# Patient Record
Sex: Female | Born: 1940 | Race: White | Hispanic: No | Smoking: Never smoker
Health system: Southern US, Community
[De-identification: ages and names within clinical notes are randomized; demographics above are authoritative.]

## PROBLEM LIST (undated history)

## (undated) DIAGNOSIS — I10 Essential (primary) hypertension: Secondary | ICD-10-CM

## (undated) DIAGNOSIS — E785 Hyperlipidemia, unspecified: Secondary | ICD-10-CM

## (undated) DIAGNOSIS — R011 Cardiac murmur, unspecified: Secondary | ICD-10-CM

## (undated) DIAGNOSIS — F32A Depression, unspecified: Secondary | ICD-10-CM

## (undated) DIAGNOSIS — I340 Nonrheumatic mitral (valve) insufficiency: Secondary | ICD-10-CM

## (undated) DIAGNOSIS — Z8719 Personal history of other diseases of the digestive system: Secondary | ICD-10-CM

## (undated) DIAGNOSIS — I509 Heart failure, unspecified: Secondary | ICD-10-CM

## (undated) DIAGNOSIS — E039 Hypothyroidism, unspecified: Secondary | ICD-10-CM

## (undated) DIAGNOSIS — H9319 Tinnitus, unspecified ear: Secondary | ICD-10-CM

## (undated) DIAGNOSIS — R0602 Shortness of breath: Secondary | ICD-10-CM

## (undated) DIAGNOSIS — F329 Major depressive disorder, single episode, unspecified: Secondary | ICD-10-CM

## (undated) HISTORY — DX: Personal history of other diseases of the digestive system: Z87.19

## (undated) HISTORY — PX: BREAST SURGERY: SHX581

## (undated) HISTORY — PX: COLON SURGERY: SHX602

## (undated) HISTORY — DX: Nonrheumatic mitral (valve) insufficiency: I34.0

## (undated) HISTORY — DX: Tinnitus, unspecified ear: H93.19

## (undated) HISTORY — DX: Hyperlipidemia, unspecified: E78.5

---

## 1996-04-06 HISTORY — PX: CHOLECYSTECTOMY: SHX55

## 1998-01-15 ENCOUNTER — Ambulatory Visit (HOSPITAL_COMMUNITY): Admission: RE | Admit: 1998-01-15 | Discharge: 1998-01-15 | Payer: Self-pay

## 1999-02-03 ENCOUNTER — Ambulatory Visit (HOSPITAL_COMMUNITY): Admission: RE | Admit: 1999-02-03 | Discharge: 1999-02-03 | Payer: Self-pay | Admitting: Family Medicine

## 1999-02-03 ENCOUNTER — Encounter: Payer: Self-pay | Admitting: Family Medicine

## 1999-12-01 ENCOUNTER — Other Ambulatory Visit: Admission: RE | Admit: 1999-12-01 | Discharge: 1999-12-01 | Payer: Self-pay | Admitting: Family Medicine

## 2001-02-14 ENCOUNTER — Encounter: Payer: Self-pay | Admitting: Family Medicine

## 2001-02-14 ENCOUNTER — Ambulatory Visit (HOSPITAL_COMMUNITY): Admission: RE | Admit: 2001-02-14 | Discharge: 2001-02-14 | Payer: Self-pay | Admitting: Family Medicine

## 2001-09-28 ENCOUNTER — Encounter: Admission: RE | Admit: 2001-09-28 | Discharge: 2001-09-28 | Payer: Self-pay | Admitting: Family Medicine

## 2001-09-28 ENCOUNTER — Encounter: Payer: Self-pay | Admitting: Family Medicine

## 2003-09-30 ENCOUNTER — Emergency Department (HOSPITAL_COMMUNITY): Admission: EM | Admit: 2003-09-30 | Discharge: 2003-09-30 | Payer: Self-pay | Admitting: *Deleted

## 2003-10-16 ENCOUNTER — Ambulatory Visit (HOSPITAL_COMMUNITY): Admission: RE | Admit: 2003-10-16 | Discharge: 2003-10-16 | Payer: Self-pay | Admitting: Family Medicine

## 2003-12-25 ENCOUNTER — Inpatient Hospital Stay (HOSPITAL_COMMUNITY): Admission: RE | Admit: 2003-12-25 | Discharge: 2003-12-27 | Payer: Self-pay | Admitting: Obstetrics and Gynecology

## 2003-12-25 ENCOUNTER — Encounter (INDEPENDENT_AMBULATORY_CARE_PROVIDER_SITE_OTHER): Payer: Self-pay | Admitting: Specialist

## 2003-12-25 HISTORY — PX: ABDOMINAL HYSTERECTOMY: SHX81

## 2004-05-26 ENCOUNTER — Encounter (INDEPENDENT_AMBULATORY_CARE_PROVIDER_SITE_OTHER): Payer: Self-pay | Admitting: *Deleted

## 2004-05-26 ENCOUNTER — Ambulatory Visit (HOSPITAL_COMMUNITY): Admission: RE | Admit: 2004-05-26 | Discharge: 2004-05-26 | Payer: Self-pay | Admitting: Gastroenterology

## 2006-11-30 ENCOUNTER — Ambulatory Visit (HOSPITAL_COMMUNITY): Admission: RE | Admit: 2006-11-30 | Discharge: 2006-11-30 | Payer: Self-pay | Admitting: Family Medicine

## 2010-06-26 ENCOUNTER — Other Ambulatory Visit: Payer: Self-pay | Admitting: Gastroenterology

## 2010-08-22 NOTE — H&P (Signed)
NAME:  Elizabeth Nicholson, Elizabeth Nicholson         ACCOUNT NO.:  [id number]   MEDICAL RECORD NO.:  [id number]         PATIENT TYPE:  WINP   LOCATION:                                FACILITY:  [facility]   PHYSICIAN:  Malva Limes, M.D.    DATE OF BIRTH:  April 08, 1940   DATE OF ADMISSION:  12/25/2003  DATE OF DISCHARGE:                                HISTORY & PHYSICAL   HISTORY OF PRESENT ILLNESS:  The patient is a 70 year old white female G2,  P2, who presents to [facility] today for a total vaginal hysterectomy,  bilateral salpingo-oophorectomy, anterior and posterior colporrhaphy  secondary to a one year history of worsening uterine prolapse.  The patient  also has a large cystocele and moderate size rectocele which is causing  significant symptoms.  The patient originally went to see Dr. Ewing Schlein for  difficulty with her bowel function.  She stated that she was having to place  her finger in her vagina to allow her to have a bowel movement.  The patient  has seen no blood in her stool and has tried to use laxatives and stool  softeners without success.  The patient has rare loss of urine. At surgery,  she did have a pessary placed and no significant loss of urine was noted at  this time.  The patient had two traumatic vaginal deliveries and a posterior  colporrhaphy approximately 30 years ago.  The patient has not taken estrogen  replacement therapy.  The patient does have first degree uterine prolapse.  The uterus is normal in size and shape, and she has no abnormal bleeding.  Also, prior to surgery, the patient was noted to have endocervical polyp  which was biopsied and was benign.   ALLERGIES:  The patient has an allergy to SULFA.   SOCIAL HISTORY:  She denies smoking, alcohol or drug use. The patient is  employed as a Engineer, mining for [employer].   PAST MEDICAL HISTORY:  The patient is a diabetic who is well controlled with  oral medication.  She also has hypothyroidism.   PAST SURGICAL  HISTORY:  Her past surgeries include two vaginal deliveries,  cholecystectomy, and breast biopsy x2.   CURRENT MEDICATIONS:  1.  Synthroid 88 mcg per day.  2.  Metformin 500 mg per day.  3.  Gemfibrozil 600 mg two times per day.  4.  Benazepril/hydrochlorothiazide 10/12.5 mg every day.   FAMILY HISTORY:  Significant for cerebrovascular disease, hyperlipidemia,  diabetes, and breast cancer.   PHYSICAL EXAMINATION:  VITAL SIGNS:  The patient's vital signs are stable.  She is afebrile.  HEENT:  Exam is within normal limits.  LUNGS:  Her lungs are clear to auscultation.  CARDIOVASCULAR:  Regular rate and rhythm;  no murmurs heard.  ABDOMEN:  Soft, nontender, nondistended.  There are no masses or  organomegaly.  PELVIC:  The external genitalia are atrophic.  There is a small fissure in  the posterior fourchette.  In the vagina, the patient is noted to have a  large cystocele protruding through the introitus along with a moderate  rectocele.  There is evidence of  a past repair in the proximal one-third of  the posterior aspect of the vagina.  She also is noted to have poor anal  sphincter tone.  Uterus is normal size and shape.  There is a first degree  prolapse.  There are no adnexal masses palpated.  EXTREMITIES:  Within normal limits.   IMPRESSION:  1.  Symptomatic uterine prolapse with rectocele and cystocele.  2.  Diabetes mellitus.  3.  Hypothyroidism.   PLAN:  Proceed with total abdominal hysterectomy, bilateral salpingo-  oophorectomy, and anterior and posterior colporrhaphy.      MA/MEDQ  D:  12/24/2003  T:  12/24/2003  Job:  [id number]   cc:   Petra Kuba, M.D.  [address]  [place]  [place] [address]  Fax: [phone]

## 2010-08-22 NOTE — Op Note (Signed)
NAME:  Elizabeth Nicholson, Elizabeth Nicholson                 ACCOUNT NO.:  [id number]   MEDICAL RECORD NO.:  [id number]          PATIENT TYPE:  AMB   LOCATION:  ENDO                         FACILITY:  [facility]   PHYSICIAN:  Petra Kuba, M.D.    DATE OF BIRTH:  1941/01/22   DATE OF PROCEDURE:  DATE OF DISCHARGE:                                 OPERATIVE REPORT   PROCEDURE:  Colonoscopy with hot biopsy.   ENDOSCOPIST:  Petra Kuba, M.D.   INDICATIONS FOR PROCEDURE:  A patient with alternating diarrhea and  constipation, due for a colonic screening.  A consent was signed after the  risks, benefits, methods and options were thoroughly discussed in the  office.   MEDICINES USED:  Demerol 80 mg, Versed 8 mg.   DESCRIPTION OF PROCEDURE:  A rectal inspection is pertinent for external  hemorrhoids, small.  A digital exam was negative.  The video pediatric  adjustable colonoscope was inserted with some difficulty due to a tortuous  sigmoid.  With rolling her on her back and with abdominal pressure, was able  to be advanced to the cecum.  On insertion, left greater than right,  diverticula were seen, but no other abnormalities.  The cecum was identified  by the appendiceal orifice and the ileocecal valve.  The prep was adequate.  There was some liquid stool that required washing and suctioning.  On slow  withdrawal through the colon, a small mid-ascending polyp was seen and was  hot biopsied x2 and put in the first container.  Another tiny polyp was seen  in the splenic flexure which was hot biopsied and put in a separate  container.  No other polypoid lesions were seen as we slowly withdrew back  to the rectum.  The left, greater than right, diverticula were confirmed.  There was a little bit of scope trauma in the distal sigmoid, but no other  abnormalities or complications or problems.  Once back in the rectum, an  anal rectal pull-through on retroflexion confirmed some small hemorrhoids.  The scope was drained.   Air was suctioned.  The scope was removed.   The patient tolerated the procedure well.  There was no immediate  significant complication.   ENDOSCOPIC ASSESSMENT:  1.  Internal and external hemorrhoids.  2.  Left, greater than right, diverticula.  3.  Two tiny small polyps hot biopsied in the ascending and splenic flexure.  4.  Otherwise within normal limits to the cecum.   PLAN:  Will await pathology.  Probably recheck a colon screening in five  years.  Have her to see back p.r.n.  Otherwise return her care to Dr. Jennette Kettle for the customary health care maintenance, to include yearly rectals  and guaiacs.      MEM/MEDQ  D:  05/26/2004  T:  05/26/2004  Job:  [id number]   cc:   Jennette Kettle, M.D.

## 2010-08-22 NOTE — Discharge Summary (Signed)
NAME:  SKY, PRIMO                 ACCOUNT NO.:  [id number]   MEDICAL RECORD NO.:  [id number]          PATIENT TYPE:  INP   LOCATION:  9309                          FACILITY:  [facility]   PHYSICIAN:  Malva Limes, M.D.    DATE OF BIRTH:  22-Apr-1940   DATE OF ADMISSION:  12/25/2003  DATE OF DISCHARGE:  12/27/2003                                 DISCHARGE SUMMARY   PRINCIPAL DISCHARGE DIAGNOSES:  1.  Symptomatic uterine prolapse.  2.  Cystocele.  3.  Rectocele.   PRINCIPAL PROCEDURES:  1.  Total vaginal hysterectomy and bilateral salpingo-oophorectomy.  2.  Anterior and posterior colporrhaphy.   HISTORY OF PRESENT ILLNESS:  Ms. Kathan is a 70 year old white female G2 P2  who [facility] with a 1-year  history of worsening symptoms of uterine prolapse with a large cystocele and  a moderate-size rectocele.  A complete description of the events which led  up to this admission can be found in the dictated History and Physical.   HOSPITAL COURSE:  The patient underwent the procedure on December 25, 2003  without complications.  Estimated blood loss was 250 mL.  The patient's  pathology is benign.  The patient's preoperative hemoglobin was 13.3,  postoperative 11.7.  The patient did have a bowel movement prior to  discharge.  She was eating a regular diet without difficulty.  The patient  underwent a voiding trial on postoperative day #2.  She did have a postvoid  residual of 300 and therefore the catheter was left.  The patient will be  discharged to home with Percocet to take p.r.n.  She will be instructed to  follow up in the office on Monday for her catheter to be removed and another  postvoid residual checked.  The patient also was sent home with Colace 100  mg p.o. b.i.d. and Cipro 250 mg p.o. b.i.d. x3 days.     Mark   MA/MEDQ  D:  12/27/2003  T:  12/28/2003  Job:  [id number]

## 2010-08-22 NOTE — Op Note (Signed)
NAME:  Elizabeth Nicholson, Elizabeth Nicholson                 ACCOUNT NO.:  [id number]   MEDICAL RECORD NO.:  [id number]          PATIENT TYPE:  INP   LOCATION:  9309                          FACILITY:  [facility]   PHYSICIAN:  Malva Limes, M.D.    DATE OF BIRTH:  November 16, 1940   DATE OF PROCEDURE:  12/25/2003  DATE OF DISCHARGE:                                 OPERATIVE REPORT   PREOPERATIVE DIAGNOSES:  1.  Symptomatic uterine prolapse.  2.  Cystocele.  3.  Rectocele.   POSTOPERATIVE DIAGNOSES:  1.  Symptomatic uterine prolapse.  2.  Cystocele.  3.  Rectocele.   PROCEDURE:  1.  Total vaginal hysterectomy.  2.  Bilateral salpingo-oophorectomy.  3.  Anterior colporrhaphy.  4.  Posterior colporrhaphy.   SURGEON:  Malva Limes, M.D.   ASSISTANT:  Luvenia Redden, M.D.   ANESTHESIA:  General endotracheal.   ANTIBIOTICS:  Ancef 1 g.   ESTIMATED BLOOD LOSS:  250 mL.   COMPLICATIONS:  None.   SPECIMENS:  Cervix, uterus, fallopian tubes and ovaries sent to pathology.   DRAINS:  Foley bedside drainage.   DESCRIPTION OF PROCEDURE:  The patient was taken to the operating room where  she was placed in the dorsal supine position. A general anesthetic was  administered without complications. She was then placed in dorsal lithotomy  position. She was prepped with Hibiclens and her bladder was drained with a  red rubber catheter.  She was draped in the usual fashion for this  procedure.  A weighted speculum was placed in the vagina, the cervix was  then injected with lidocaine with 1% epinephrine.  The posterior cul-de-sac  was entered sharply. The uterosacral ligaments were then bilaterally  clamped, cut, and ligated with #0 Monocryl suture. The remaining cervix was  then circumscribed, the anterior cul-de-sac was entered sharply. The bladder  __________ were then bilaterally clamped, cut, and ligated with #0 Monocryl  suture. The cardinal ligaments were serially clamped, cut, and ligated with  #0 Monocryl  suture. The uterine vessels were bilaterally clamped, cut, and  ligated with #0 Monocryl suture. Once the level of the fallopian tube was  reached, the uterus was inverted and the triple pedicle which included the  ovarian ligament, the fallopian tube and the round ligament were bilaterally  clamped, cut and the specimen removed. At this point, the right adnexa was  isolated, clamped, cut, and ligated x2 with #0 Monocryl suture. A similar  procedure was performed on the opposite side. At this point, pedicles are  checked and found to be hemostatic.  There was a small area of vaginal  bleeding on the left vaginal cuff which was made hemostatic with interrupted  #0 Vicryl suture in a figure-of-eight fashion.  At this point, the bowel was  packed away with a 4 x 18 and the pedicles all checked.  Hemostasis appeared  to be adequate.  The Foley catheter was then placed and clear yellow urine  noted.  The sponge was then removed and the parietoperitoneum closed with 2-  0 Vicryl in a pursestring fashion.  At this point,  the anterior vaginal cuff  was grasped with two Allis's and injected with 1% lidocaine with  epinephrine. The anterior vaginal wall was then opened in a vertical fashion  and the large cystocele dissected away from the vaginal mucosa.  Once this  was accomplished, Kelly plication was performed using 2-0 Vicryl suture. The  large cystocele was then reduced using 2-0 Vicryl in pursestring and  interrupted fashion.  Following this, the excess vaginal mucosa was excised  and the anterior vaginal wall closed using 2-0 Vicryl in a running locking  fashion.  Also at this point, the posterior vaginal cuff was closed using 2-  0 Vicryl in a running fashion in a vertical manner.  This concluded the  anterior colporrhaphy and the vaginal hysterectomy.  At this point, the  perineum was grasped with two Allis clamps and the posterior vagina injected  with 1% lidocaine with epinephrine.  This  area was then incised with the  blade and opened using the Metzenbaum scissors in a vertical fashion. The  rectocele was then dissected from the fascia and reduced using 2-0 Vicryl in  a running fashion.  The rectal myofascia was then reapproximated in the  midline using 2-0 Vicryl suture. The posterior vaginal wall was then closed  using 2-0 Vicryl in a running locking fashion and the perineum repaired in a  manner similar to an episiotomy. This concluded the procedure. The vagina  was then packed using 2 inch iodoform gauze. The patient was extubated and  taken to the recovery room in stable condition.  Instrument and lab counts  were correct x2.      MA/MEDQ  D:  12/25/2003  T:  12/25/2003  Job:  [id number]

## 2010-09-25 ENCOUNTER — Inpatient Hospital Stay (INDEPENDENT_AMBULATORY_CARE_PROVIDER_SITE_OTHER)
Admission: RE | Admit: 2010-09-25 | Discharge: 2010-09-25 | Disposition: A | Discharge status: Home / Self Care | Payer: Medicare Other | Source: Ambulatory Visit | Attending: Family Medicine | Admitting: Family Medicine

## 2010-09-25 DIAGNOSIS — IMO0002 Reserved for concepts with insufficient information to code with codable children: Secondary | ICD-10-CM

## 2012-01-05 HISTORY — PX: CARDIAC CATHETERIZATION: SHX172

## 2012-01-07 ENCOUNTER — Inpatient Hospital Stay (HOSPITAL_COMMUNITY)
Admission: AD | Admit: 2012-01-07 | Discharge: 2012-01-10 | DRG: 287 | Disposition: A | Discharge status: Home / Self Care | Payer: Medicare Other | Source: Ambulatory Visit | Attending: Interventional Cardiology | Admitting: Interventional Cardiology

## 2012-01-07 ENCOUNTER — Inpatient Hospital Stay (HOSPITAL_COMMUNITY): Payer: Medicare Other

## 2012-01-07 ENCOUNTER — Encounter (HOSPITAL_COMMUNITY): Payer: Self-pay | Admitting: Interventional Cardiology

## 2012-01-07 DIAGNOSIS — E785 Hyperlipidemia, unspecified: Secondary | ICD-10-CM | POA: Diagnosis present

## 2012-01-07 DIAGNOSIS — Z8249 Family history of ischemic heart disease and other diseases of the circulatory system: Secondary | ICD-10-CM

## 2012-01-07 DIAGNOSIS — I050 Rheumatic mitral stenosis: Secondary | ICD-10-CM | POA: Diagnosis present

## 2012-01-07 DIAGNOSIS — I5032 Chronic diastolic (congestive) heart failure: Secondary | ICD-10-CM | POA: Diagnosis present

## 2012-01-07 DIAGNOSIS — I5031 Acute diastolic (congestive) heart failure: Principal | ICD-10-CM | POA: Diagnosis present

## 2012-01-07 DIAGNOSIS — N179 Acute kidney failure, unspecified: Secondary | ICD-10-CM | POA: Diagnosis present

## 2012-01-07 DIAGNOSIS — I10 Essential (primary) hypertension: Secondary | ICD-10-CM | POA: Diagnosis present

## 2012-01-07 DIAGNOSIS — Z794 Long term (current) use of insulin: Secondary | ICD-10-CM

## 2012-01-07 DIAGNOSIS — Z8719 Personal history of other diseases of the digestive system: Secondary | ICD-10-CM | POA: Insufficient documentation

## 2012-01-07 DIAGNOSIS — I2510 Atherosclerotic heart disease of native coronary artery without angina pectoris: Secondary | ICD-10-CM | POA: Diagnosis present

## 2012-01-07 DIAGNOSIS — E119 Type 2 diabetes mellitus without complications: Secondary | ICD-10-CM | POA: Diagnosis present

## 2012-01-07 DIAGNOSIS — I509 Heart failure, unspecified: Secondary | ICD-10-CM | POA: Diagnosis present

## 2012-01-07 DIAGNOSIS — E669 Obesity, unspecified: Secondary | ICD-10-CM | POA: Diagnosis present

## 2012-01-07 DIAGNOSIS — E039 Hypothyroidism, unspecified: Secondary | ICD-10-CM | POA: Diagnosis present

## 2012-01-07 HISTORY — DX: Shortness of breath: R06.02

## 2012-01-07 HISTORY — DX: Major depressive disorder, single episode, unspecified: F32.9

## 2012-01-07 HISTORY — DX: Cardiac murmur, unspecified: R01.1

## 2012-01-07 HISTORY — DX: Depression, unspecified: F32.A

## 2012-01-07 HISTORY — DX: Heart failure, unspecified: I50.9

## 2012-01-07 HISTORY — DX: Essential (primary) hypertension: I10

## 2012-01-07 HISTORY — DX: Hypothyroidism, unspecified: E03.9

## 2012-01-07 LAB — COMPREHENSIVE METABOLIC PANEL
AST: 35 U/L (ref 0–37)
Albumin: 4.2 g/dL (ref 3.5–5.2)
Alkaline Phosphatase: 84 U/L (ref 39–117)
BUN: 21 mg/dL (ref 6–23)
CO2: 25 mEq/L (ref 19–32)
Chloride: 104 mEq/L (ref 96–112)
GFR calc non Af Amer: 50 mL/min — ABNORMAL LOW (ref >90)
Potassium: 4.3 mEq/L (ref 3.5–5.1)
Total Bilirubin: 0.6 mg/dL (ref 0.3–1.2)

## 2012-01-07 LAB — CBC WITH DIFFERENTIAL/PLATELET
Basophils Absolute: 0.1 10*3/uL (ref 0.0–0.1)
Basophils Relative: 1 % (ref 0–1)
Eosinophils Relative: 1 % (ref 0–5)
HCT: 40.5 % (ref 36.0–46.0)
Hemoglobin: 14.1 g/dL (ref 12.0–15.0)
MCH: 31.2 pg (ref 26.0–34.0)
MCHC: 34.8 g/dL (ref 30.0–36.0)
MCV: 89.6 fL (ref 78.0–100.0)
Monocytes Absolute: 0.6 10*3/uL (ref 0.1–1.0)
Monocytes Relative: 5 % (ref 3–12)
Neutro Abs: 9.9 10*3/uL — ABNORMAL HIGH (ref 1.7–7.7)
RDW: 13.2 % (ref 11.5–15.5)

## 2012-01-07 LAB — PROTIME-INR: Prothrombin Time: 13.6 seconds (ref 11.6–15.2)

## 2012-01-07 LAB — PRO B NATRIURETIC PEPTIDE: Pro B Natriuretic peptide (BNP): 506.6 pg/mL — ABNORMAL HIGH (ref 0–125)

## 2012-01-07 MED ORDER — ENOXAPARIN SODIUM 40 MG/0.4ML ~~LOC~~ SOLN
40.0000 mg | SUBCUTANEOUS | Status: DC
  Administered 2012-01-07: 40 mg via SUBCUTANEOUS
  Filled 2012-01-07 (×2): qty 0.4

## 2012-01-07 MED ORDER — INSULIN ASPART 100 UNIT/ML ~~LOC~~ SOLN
0.0000 [IU] | Freq: Three times a day (TID) | SUBCUTANEOUS | Status: DC
  Administered 2012-01-07 – 2012-01-09 (×3): 1 [IU] via SUBCUTANEOUS
  Administered 2012-01-09: 17:00:00 via SUBCUTANEOUS
  Administered 2012-01-10: 1 [IU] via SUBCUTANEOUS

## 2012-01-07 MED ORDER — ASPIRIN EC 81 MG PO TBEC
81.0000 mg | DELAYED_RELEASE_TABLET | Freq: Every day | ORAL | Status: DC
  Administered 2012-01-07 – 2012-01-10 (×3): 81 mg via ORAL
  Filled 2012-01-07 (×4): qty 1

## 2012-01-07 MED ORDER — SODIUM CHLORIDE 0.9 % IJ SOLN
3.0000 mL | Freq: Two times a day (BID) | INTRAMUSCULAR | Status: DC
  Administered 2012-01-07 – 2012-01-08 (×2): 3 mL via INTRAVENOUS

## 2012-01-07 MED ORDER — ACETAMINOPHEN 325 MG PO TABS: 650.0000 mg | ORAL_TABLET | ORAL | Status: DC | PRN

## 2012-01-07 MED ORDER — INSULIN LISPRO 100 UNIT/ML ~~LOC~~ SOLN: 12.0000 [IU] | Freq: Three times a day (TID) | SUBCUTANEOUS | Status: DC

## 2012-01-07 MED ORDER — IBUPROFEN 400 MG PO TABS
400.0000 mg | ORAL_TABLET | Freq: Four times a day (QID) | ORAL | Status: DC | PRN
  Filled 2012-01-07: qty 1

## 2012-01-07 MED ORDER — SODIUM CHLORIDE 0.9 % IJ SOLN: 3.0000 mL | Freq: Two times a day (BID) | INTRAMUSCULAR | Status: DC

## 2012-01-07 MED ORDER — SODIUM CHLORIDE 0.9 % IV SOLN: INTRAVENOUS | Status: DC

## 2012-01-07 MED ORDER — FUROSEMIDE 10 MG/ML IJ SOLN
40.0000 mg | Freq: Two times a day (BID) | INTRAMUSCULAR | Status: DC
  Administered 2012-01-07 (×2): 40 mg via INTRAVENOUS
  Filled 2012-01-07 (×4): qty 4

## 2012-01-07 MED ORDER — ATORVASTATIN CALCIUM 10 MG PO TABS
10.0000 mg | ORAL_TABLET | Freq: Every day | ORAL | Status: DC
  Administered 2012-01-08 – 2012-01-09 (×2): 10 mg via ORAL
  Filled 2012-01-07 (×4): qty 1

## 2012-01-07 MED ORDER — SODIUM CHLORIDE 0.9 % IV SOLN: 250.0000 mL | INTRAVENOUS | Status: DC | PRN

## 2012-01-07 MED ORDER — DIAZEPAM 5 MG PO TABS
5.0000 mg | ORAL_TABLET | ORAL | Status: AC
  Administered 2012-01-08: 5 mg via ORAL
  Filled 2012-01-07: qty 1

## 2012-01-07 MED ORDER — INSULIN GLARGINE 100 UNIT/ML ~~LOC~~ SOLN
36.0000 [IU] | Freq: Every day | SUBCUTANEOUS | Status: DC
  Administered 2012-01-07 – 2012-01-09 (×3): 36 [IU] via SUBCUTANEOUS

## 2012-01-07 MED ORDER — METOPROLOL SUCCINATE ER 50 MG PO TB24
50.0000 mg | ORAL_TABLET | Freq: Every day | ORAL | Status: DC
  Administered 2012-01-07 – 2012-01-10 (×4): 50 mg via ORAL
  Filled 2012-01-07 (×4): qty 1

## 2012-01-07 MED ORDER — FUROSEMIDE 10 MG/ML IJ SOLN: 40.0000 mg | Freq: Two times a day (BID) | INTRAMUSCULAR | Status: DC

## 2012-01-07 MED ORDER — INSULIN ASPART 100 UNIT/ML ~~LOC~~ SOLN
18.0000 [IU] | Freq: Every day | SUBCUTANEOUS | Status: DC
  Administered 2012-01-07 – 2012-01-09 (×3): 18 [IU] via SUBCUTANEOUS

## 2012-01-07 MED ORDER — SODIUM CHLORIDE 0.9 % IJ SOLN: 3.0000 mL | INTRAMUSCULAR | Status: DC | PRN

## 2012-01-07 MED ORDER — POTASSIUM CHLORIDE CRYS ER 20 MEQ PO TBCR
20.0000 meq | EXTENDED_RELEASE_TABLET | Freq: Every day | ORAL | Status: DC
  Administered 2012-01-07: 20 meq via ORAL
  Filled 2012-01-07 (×2): qty 1

## 2012-01-07 MED ORDER — ONDANSETRON HCL 4 MG/2ML IJ SOLN: 4.0000 mg | Freq: Four times a day (QID) | INTRAMUSCULAR | Status: DC | PRN

## 2012-01-07 MED ORDER — CITALOPRAM HYDROBROMIDE 10 MG PO TABS
10.0000 mg | ORAL_TABLET | Freq: Every day | ORAL | Status: DC
  Administered 2012-01-07 – 2012-01-10 (×4): 10 mg via ORAL
  Filled 2012-01-07 (×4): qty 1

## 2012-01-07 MED ORDER — LEVOTHYROXINE SODIUM 112 MCG PO TABS
112.0000 ug | ORAL_TABLET | Freq: Every day | ORAL | Status: DC
  Administered 2012-01-07 – 2012-01-10 (×4): 112 ug via ORAL
  Filled 2012-01-07 (×4): qty 1

## 2012-01-07 MED ORDER — ASPIRIN 81 MG PO CHEW
324.0000 mg | CHEWABLE_TABLET | ORAL | Status: AC
  Administered 2012-01-08: 324 mg via ORAL
  Filled 2012-01-07: qty 4

## 2012-01-07 MED ORDER — SODIUM CHLORIDE 0.9 % IV SOLN
1.0000 mL/kg/h | INTRAVENOUS | Status: DC
  Administered 2012-01-07 – 2012-01-08 (×2): 1 mL/kg/h via INTRAVENOUS

## 2012-01-07 MED ORDER — INSULIN ASPART 100 UNIT/ML ~~LOC~~ SOLN
12.0000 [IU] | Freq: Two times a day (BID) | SUBCUTANEOUS | Status: DC
  Administered 2012-01-09 – 2012-01-10 (×2): 12 [IU] via SUBCUTANEOUS

## 2012-01-07 MED ORDER — LISINOPRIL 10 MG PO TABS
10.0000 mg | ORAL_TABLET | Freq: Every day | ORAL | Status: DC
  Administered 2012-01-07: 10 mg via ORAL
  Filled 2012-01-07 (×2): qty 1

## 2012-01-07 NOTE — H&P (Signed)
Elizabeth Nicholson is a 71 y.o. female  Admit date: 01/07/2012 Referring Physician: Merri Brunette, MD Primary Cardiologist: [facility] Katrinka Blazing, III, MD Chief complaint / reason for admission:  Acute dyspnea with recumbency  HPI:  The patient came to the office today for a nuclear myocardial perfusion study to rule out coronary artery disease. She is up to 3 month history of progressive dyspnea on exertion and chest pressure. She is no mitral stenosis, and a recent echocardiogram demonstrated that this process was mild. While under the camera today taking her rest images she suddenly became severely dyspneic and on exam developed rales throughout the lower and middle lung fields. She had oxygen desaturation down to 87%. She became pale. According to the daughter this is the way she appears if she does any amount of walking. I have decided to admit for therapy of CHF and to consider left and right heart catheterization to r/o CAD and re-evaluate mitral valve disease.  After her recent echocardiogram which demonstrated normal LV function and mild mitral stenosis, beta blocker therapy was started to decrease the diastolic filling time. This is slightly improved exertional dyspnea.    PMH:    Past Medical History  Diagnosis Date  . Hypertension   . Hypothyroidism   . Depression   . Diabetes mellitus   . Heart murmur   . Shortness of breath   . CHF (congestive heart failure)     PSH:    Past Surgical History  Procedure Date  . Cholecystectomy   . Breast surgery   . Abdominal hysterectomy     ALLERGIES:   Sulfa antibiotics and Zoloft  Prior to Admit Meds:   Prescriptions prior to admission  Medication Sig Dispense Refill  . benazepril-hydrochlorthiazide (LOTENSIN HCT) 10-12.5 MG per tablet Take 1 tablet by mouth daily.      . citalopram (CELEXA) 20 MG tablet Take 10 mg by mouth daily.      Marland Kitchen ibuprofen (ADVIL,MOTRIN) 200 MG tablet Take 400-800 mg by mouth every 6 (six) hours as needed. For  pain      . insulin glargine (LANTUS) 100 UNIT/ML injection Inject 36 Units into the skin at bedtime.      . insulin lispro (HUMALOG) 100 UNIT/ML injection Inject 12-22 Units into the skin 3 (three) times daily before meals. Use 12 units with breakfast and Lunch, and 18 units with dinner. Add 1 additional unit for every 30 points over 100.      Marland Kitchen Krill Oil CAPS Take 1 capsule by mouth daily.      Marland Kitchen levothyroxine (SYNTHROID, LEVOTHROID) 112 MCG tablet Take 112 mcg by mouth daily.      . metoprolol succinate (TOPROL-XL) 25 MG 24 hr tablet Take 50 mg by mouth daily.      . Multiple Minerals (CHELATED MULTIPLE MINERAL PO) Take 2 tablets by mouth daily.      Marland Kitchen OVER THE COUNTER MEDICATION Take 2 tablets by mouth daily. Mega antioxidant      . Probiotic Product (PROBIOTIC DAILY PO) Take 1 tablet by mouth daily.      . rosuvastatin (CRESTOR) 5 MG tablet Take 5 mg by mouth every Monday, Wednesday, and Friday.       Family HX:    Family History  Problem Relation Age of Onset  . Coronary artery disease Mother   . Diabetes type II Father   . Hypothyroidism Father   . Parkinsonism Father   . Coronary artery disease Brother   .  Diabetes type II Brother    Social HX:    History   Social History  . Marital Status: Married    Spouse Name: N/A    Number of Children: 2  . Years of Education: N/A   Occupational History  . Not on file.   Social History Main Topics  . Smoking status: Never Smoker   . Smokeless tobacco: Never Used  . Alcohol Use: No  . Drug Use: No  . Sexually Active: No   Other Topics Concern  . Not on file   Social History Narrative   Widowed, two children     ROS: DOE and increased fatigue. Denies chills, fever, phlegm production, and other symptoms of respiratory infection  Physical Exam: Blood pressure 113/50, pulse 70, temperature 98.9 F (37.2 C), temperature source Oral, resp. rate 16, weight 89.2 kg (196 lb 10.4 oz), SpO2 96.00%.   Appears dyspneic. Skin  appears pale. Neck exam reveals no JVD and no carotid bruits are heard. Chest reveals bilateral especially mid lung rales. Cardiac exam is quiet and there is an S4 Gallop. No murmur or snap is heard. Abdomen is soft and there are no organ or vascular abnormalities or impulses. The extremities reveal no edema and intact pulses. Neuro is without focal abnormality and there is apprehension  Echocardiogram: 12/18/11 Physician Review Conclusions: 1. Mild to moderate mitral stenosis. 2. Mild mitral valve regurgitation. 3. Mild left atrial enlargement. 4. Left ventricular ejection fraction estimated by 2D at 65-70 percent. 5. There is borderline concentric left ventricle hypertrophy. 6. Analysis of mitral valve inflow, pulmonary vein Doppler and tissue Doppler suggests grade II diastolic dysfunction with elevated left atrial pressure. 7. Trace of pulmonic valve regurgitation. 8. The aortic valve is sclerotic but opens well. 9. Trace aortic valve regurgitation   Labs:   BNP    Component Value Date/Time   PROBNP 506.6* 01/07/2012 1252   Lab Results  Component Value Date   WBC 12.3* 01/07/2012     Lab 01/07/12 1251  NA 141  K 4.3  CL 104  CO2 25  BUN 21  CREATININE 1.09  CALCIUM 9.9  PROT 7.7  BILITOT 0.6  ALKPHOS 84  ALT 41*  AST 35  GLUCOSE 150*     Radiology:   RADIOLOGY REPORT*  Clinical Data: Congestive heart failure.  CHEST - 2 VIEW  Comparison: None.  Findings: Cardiomediastinal silhouette appears normal. Bony thorax  is intact. Mild bilateral perihilar opacity is noted concerning  either for pneumonia or edema. Right lung is clear.  IMPRESSION:  Mild bilateral perihilar opacity concerning for pneumonia or edema.  Follow-up radiographs are recommended.  Original Report Authenticated By: Venita Sheffield., M.D.   EKG:  LAA and limb lead reversal.  ASSESSMENT:  1. Acute diastolic heart failure, etiology uncertain. Possibly related to mitral stenosis vs  ischemic origin  2. Moderate mitral stenosis by recent echo  3. Exertional chest pressure raising the question of CAD.   Plan:  1. IV lasix 2. Continue beta blocker 3. Left and right heart cath in AM. Risk of stroke, death, bleeding, allergy, limb ischemia, CABG etc discussed in detail 4. May need TEE if no CAD by  Cath  Lesleigh Noe 01/07/2012 5:21 PM

## 2012-01-08 ENCOUNTER — Encounter (HOSPITAL_COMMUNITY)
Admission: AD | Disposition: A | Discharge status: Home / Self Care | Payer: Self-pay | Source: Ambulatory Visit | Attending: Interventional Cardiology

## 2012-01-08 HISTORY — PX: LEFT AND RIGHT HEART CATHETERIZATION WITH CORONARY ANGIOGRAM: SHX5449

## 2012-01-08 LAB — POCT I-STAT 3, VENOUS BLOOD GAS (G3P V)
Bicarbonate: 24.5 mEq/L — ABNORMAL HIGH (ref 20.0–24.0)
Bicarbonate: 25 mEq/L — ABNORMAL HIGH (ref 20.0–24.0)
O2 Saturation: 69 %
TCO2: 26 mmol/L (ref 0–100)
TCO2: 26 mmol/L (ref 0–100)
pCO2, Ven: 42.3 mmHg — ABNORMAL LOW (ref 45.0–50.0)
pH, Ven: 7.371 — ABNORMAL HIGH (ref 7.250–7.300)
pO2, Ven: 37 mmHg (ref 30.0–45.0)

## 2012-01-08 LAB — HEMOGLOBIN A1C: Mean Plasma Glucose: 148 mg/dL — ABNORMAL HIGH (ref <117)

## 2012-01-08 LAB — CBC
HCT: 35.3 % — ABNORMAL LOW (ref 36.0–46.0)
Hemoglobin: 12.1 g/dL (ref 12.0–15.0)
MCV: 89.1 fL (ref 78.0–100.0)
RDW: 13.3 % (ref 11.5–15.5)
WBC: 7.3 10*3/uL (ref 4.0–10.5)

## 2012-01-08 LAB — CREATININE, SERUM: GFR calc Af Amer: 57 mL/min — ABNORMAL LOW (ref >90)

## 2012-01-08 LAB — BASIC METABOLIC PANEL
BUN: 28 mg/dL — ABNORMAL HIGH (ref 6–23)
Calcium: 9 mg/dL (ref 8.4–10.5)
GFR calc Af Amer: 48 mL/min — ABNORMAL LOW (ref >90)
GFR calc non Af Amer: 41 mL/min — ABNORMAL LOW (ref >90)
Potassium: 3.2 mEq/L — ABNORMAL LOW (ref 3.5–5.1)
Sodium: 140 mEq/L (ref 135–145)

## 2012-01-08 LAB — GLUCOSE, CAPILLARY
Glucose-Capillary: 103 mg/dL — ABNORMAL HIGH (ref 70–99)
Glucose-Capillary: 106 mg/dL — ABNORMAL HIGH (ref 70–99)
Glucose-Capillary: 144 mg/dL — ABNORMAL HIGH (ref 70–99)

## 2012-01-08 LAB — POCT I-STAT 3, ART BLOOD GAS (G3+)
Acid-Base Excess: 1 mmol/L (ref 0.0–2.0)
O2 Saturation: 95 %

## 2012-01-08 SURGERY — LEFT AND RIGHT HEART CATHETERIZATION WITH CORONARY ANGIOGRAM
Anesthesia: LOCAL

## 2012-01-08 MED ORDER — FENTANYL CITRATE 0.05 MG/ML IJ SOLN
INTRAMUSCULAR | Status: AC
  Filled 2012-01-08: qty 2

## 2012-01-08 MED ORDER — POTASSIUM CHLORIDE CRYS ER 20 MEQ PO TBCR
40.0000 meq | EXTENDED_RELEASE_TABLET | Freq: Once | ORAL | Status: AC
  Administered 2012-01-08: 40 meq via ORAL

## 2012-01-08 MED ORDER — NITROGLYCERIN 0.2 MG/ML ON CALL CATH LAB
INTRAVENOUS | Status: AC
  Filled 2012-01-08: qty 1

## 2012-01-08 MED ORDER — SODIUM CHLORIDE 0.9 % IV SOLN: INTRAVENOUS | Status: AC

## 2012-01-08 MED ORDER — LIDOCAINE HCL (PF) 1 % IJ SOLN
INTRAMUSCULAR | Status: AC
  Filled 2012-01-08: qty 30

## 2012-01-08 MED ORDER — FUROSEMIDE 20 MG PO TABS
20.0000 mg | ORAL_TABLET | Freq: Every day | ORAL | Status: AC
  Administered 2012-01-08: 20 mg via ORAL
  Filled 2012-01-08: qty 1

## 2012-01-08 MED ORDER — ACETAMINOPHEN 325 MG PO TABS: 650.0000 mg | ORAL_TABLET | ORAL | Status: DC | PRN

## 2012-01-08 MED ORDER — HEPARIN (PORCINE) IN NACL 2-0.9 UNIT/ML-% IJ SOLN
INTRAMUSCULAR | Status: AC
  Filled 2012-01-08: qty 1500

## 2012-01-08 MED ORDER — POTASSIUM CHLORIDE CRYS ER 20 MEQ PO TBCR
40.0000 meq | EXTENDED_RELEASE_TABLET | Freq: Once | ORAL | Status: DC
  Filled 2012-01-08: qty 2

## 2012-01-08 MED ORDER — MIDAZOLAM HCL 2 MG/2ML IJ SOLN
INTRAMUSCULAR | Status: AC
  Filled 2012-01-08: qty 2

## 2012-01-08 MED ORDER — ONDANSETRON HCL 4 MG/2ML IJ SOLN: 4.0000 mg | Freq: Four times a day (QID) | INTRAMUSCULAR | Status: DC | PRN

## 2012-01-08 MED ORDER — ENOXAPARIN SODIUM 40 MG/0.4ML ~~LOC~~ SOLN
40.0000 mg | SUBCUTANEOUS | Status: DC
  Administered 2012-01-09 – 2012-01-10 (×2): 40 mg via SUBCUTANEOUS
  Filled 2012-01-08 (×5): qty 0.4

## 2012-01-08 MED ORDER — FUROSEMIDE 40 MG PO TABS
40.0000 mg | ORAL_TABLET | Freq: Every day | ORAL | Status: DC
  Administered 2012-01-09 – 2012-01-10 (×2): 40 mg via ORAL
  Filled 2012-01-08 (×2): qty 1

## 2012-01-08 NOTE — Progress Notes (Signed)
To cath lab by bed, stable. 

## 2012-01-08 NOTE — CV Procedure (Addendum)
     Diagnostic Left and Right Heart Cardiac Catheterization Report  Elizabeth Nicholson  71 y.o.  female 05-26-1940  Procedure Date: 01/08/2012 Referring Physician: Merri Brunette, MD Primary Cardiologist:: H.W.B.Katrinka Blazing III, MD   PROCEDURE:  Left heart catheterization with selective coronary angiography, left ventriculogram.  INDICATIONS:  Acute diastolic heart failure in a patient with known mitral valve disease (stenosis). The studies being done to hemodynamically evaluate the mitral valve and to exclude coronary atherosclerosis that could be inducing ischemia which would contribute to diastolic heart failure .  The risks, benefits, and details of the procedure were explained to the patient.  The patient verbalized understanding and wanted to proceed.  Informed written consent was obtained.  PROCEDURE TECHNIQUE:  After Xylocaine anesthesia a 5 French sheath was placed in the right femoral artery and a 7 French sheath was placed in the right femoral vein with  single anterior needle wall sticks.   Coronary angiography was done using a 5 [place] A2 multipurpose and 5 [place] #4 left Judkins catheter.  Left ventriculography was done using a 5 [place] A2 MP catheter.  Right heart cath was performed with a 7 French balloon-tip Swan-Ganz catheter. Oximetry samples were obtained from the right atrium main pulmonary artery and the ascending aorta. Fick cardiac outputs were determined. A simultaneous wedge LV pressure recording was obtained. Thermo dilution cardiac outputs were measured. Right heart catheter was removed. Coronary angiography and left ventriculography were performed after hemodynamic recordings.   CONTRAST:  Total of 78 cc.  COMPLICATIONS:  None.    HEMODYNAMICS:  Aortic pressure was 93/40 mmHg; LV pressure was 92/3 mmHg; LVEDP 10 mm mercury; right atrial mean 6 mm mercury; right ventricular pressure 34/6 mmHg; pulmonary capillary wedge mean 9 mm mercury; PA pressure 32/10 mmHg; cardiac  output 5.28 L per minute (Fick) and 3.45 L per minute (Thermo) t; mitral valve area 3.39 cm square (Fick), and 2.21 cm square (Thermo).  There was no gradient between the left ventricle and aorta.    ANGIOGRAPHIC DATA:   The left main coronary artery is widely patent.  The left anterior descending artery is widely patent and reveals luminal irregularities in the proximal vessel..  The left circumflex artery is dominant and widely patent. Minimal luminal irregularities are noted..  The right coronary artery is nondominant, with proximal luminal irregularities.Marland Kitchen  LEFT VENTRICULOGRAM:  Left ventricular angiogram was done in the 30 RAO projection and revealed normal left ventricular wall motion and systolic function with an estimated ejection fraction of 60%.   ASSESSMENT: 1. Mild mitral stenosis with no evidence of elevated pulmonary wedge pressures. The mean mitral valve gradient is only 2.3 mmHg. This is after significant diuresis. The left ventricular end-diastolic pressure is also low normal.  2. Widely patent coronary arteries .  3. Normal left ventricular systolic function  RECOMMENDATION:    1. Medical therapy to include more aggressive diuresis and continued beta blocker administration  2. Transesophageal echo as an outpatient.  3. Consider discharge in 24-36 hours depending upon stability after medication adjustment.  4. Lasix dose will be 20-40 mg daily depending on renal function. Goal also to resume ACE once renal function stable.

## 2012-01-08 NOTE — Progress Notes (Signed)
Patient Name: Elizabeth Nicholson Date of Encounter: 01/08/2012    SUBJECTIVE:The breathing is markedly better and the cough has resolved.No chest pain.  TELEMETRY:  NSR/SB: Filed Vitals:   01/08/12 0015 01/08/12 0345 01/08/12 0500 01/08/12 0730  BP: 115/46 105/41  110/46  Pulse: 60 57  55  Temp: 98.8 F (37.1 C) 98.5 F (36.9 C)  98.8 F (37.1 C)  TempSrc: Oral Oral  Oral  Resp: 20   18  Weight:   89.5 kg (197 lb 5 oz)   SpO2: 96% 95%  98%    Intake/Output Summary (Last 24 hours) at 01/08/12 1610 Last data filed at 01/08/12 0700  Gross per 24 hour  Intake 1022.4 ml  Output   3200 ml  Net -2177.6 ml    LABS: Basic Metabolic Panel:  Basename 01/08/12 0450 01/07/12 1251  NA 140 141  K 3.2* 4.3  CL 102 104  CO2 26 25  GLUCOSE 124* 150*  BUN 28* 21  CREATININE 1.28* 1.09  CALCIUM 9.0 9.9  MG -- 1.9  PHOS -- --   CBC:  Basename 01/07/12 1251  WBC 12.3*  NEUTROABS 9.9*  HGB 14.1  HCT 40.5  MCV 89.6  PLT 230   Cardiac Enzymes:  Basename 01/07/12 1252  CKTOTAL --  CKMB --  CKMBINDEX --  TROPONINI <0.30   BNP: 554  Hemoglobin A1C:  Basename 01/07/12 1529  HGBA1C 6.8*   Radiology/Studies:  No new study  Physical Exam: Blood pressure 110/46, pulse 55, temperature 98.8 F (37.1 C), temperature source Oral, resp. rate 18, weight 89.5 kg (197 lb 5 oz), SpO2 98.00%. Weight change:    Chest is now clear. Cardiac reveals mild MR.No OS is heard. LE edema resolved  ASSESSMENT:  1. 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 2. Acute kidney injury due to diuresis 3. Diabetes mellitus, poorly controlled 4. Exertional chest pressure, r/o CAD  Plan:  1. Re-discussed coronary angiography and left and right heart cath including risks of death, MI, CVA, bleeding, kidney injury, limb ischemia, allergy, among others.Questions answered and procedure accepted.  3. IV fluid at 150 CC per hour for 2 hours prior to  cath. Stop ACE, and NSAIDS  Signed, Lesleigh Noe 01/08/2012, 8:22 AM

## 2012-01-08 NOTE — Progress Notes (Signed)
Right groin dressing removed, band aid applied. Site- level-0 continue to monitor.

## 2012-01-08 NOTE — Progress Notes (Signed)
Back from the  cath lab. Awake and alert. 

## 2012-01-08 NOTE — H&P (Signed)
See todays cardiology note.

## 2012-01-09 LAB — BASIC METABOLIC PANEL
CO2: 25 mEq/L (ref 19–32)
Calcium: 8.8 mg/dL (ref 8.4–10.5)
Glucose, Bld: 71 mg/dL (ref 70–99)
Sodium: 141 mEq/L (ref 135–145)

## 2012-01-09 LAB — GLUCOSE, CAPILLARY: Glucose-Capillary: 132 mg/dL — ABNORMAL HIGH (ref 70–99)

## 2012-01-09 MED ORDER — SODIUM CHLORIDE 0.9 % IJ SOLN
3.0000 mL | Freq: Two times a day (BID) | INTRAMUSCULAR | Status: DC
  Administered 2012-01-09 (×2): 3 mL via INTRAVENOUS

## 2012-01-09 NOTE — Progress Notes (Signed)
Pt had 5 beat run of Vtach, pt asymptomatic, no c/o pain or SOB, Dr. Arlyn Leak aware, will continue to monitor Worthy Flank, RN

## 2012-01-09 NOTE — Progress Notes (Signed)
Subjective:  Breathing improved. No chest pain or SOB.  Objective:  Vital Signs in the last 24 hours: BP 98/36  Pulse 51  Temp 97.9 F (36.6 C) (Oral)  Resp 18  Ht 5\' 6"  (1.676 m)  Wt 89.5 kg (197 lb 5 oz)  BMI 31.85 kg/m2  SpO2 97%  Physical Exam: Pleasant mildly obese WF in NAD Lungs:  Mild rales Cardiac:  Regular rhythm, normal S1 and S2, no S3 Abdomen:  Soft, nontender, no masses Extremities:  No edema present Cath site clean and dry without ecchymoses, pedal pulses present  Intake/Output from previous day: 10/04 0701 - 10/05 0700 In: 1000 [P.O.:200; I.V.:800] Out: 950 [Urine:950] Weight Filed Weights   01/07/12 1233 01/08/12 0500  Weight: 89.2 kg (196 lb 10.4 oz) 89.5 kg (197 lb 5 oz)    Lab Results: Basic Metabolic Panel:  Basename 01/09/12 0545 01/08/12 1303 01/08/12 0450  NA 141 -- 140  K 3.7 -- 3.2*  CL 106 -- 102  CO2 25 -- 26  GLUCOSE 71 -- 124*  BUN 26* -- 28*  CREATININE 1.08 1.11* --    CBC:  Basename 01/08/12 1303 01/07/12 1251  WBC 7.3 12.3*  NEUTROABS -- 9.9*  HGB 12.1 14.1  HCT 35.3* 40.5  MCV 89.1 89.6  PLT 184 230    BNP    Component Value Date/Time   PROBNP 506.6* 01/07/2012 1252    Telemetry: Sinus rhythm with occasional PVC's  Assessment/Plan:  1. Acute diastolic CHF 2. Mild to moderate mitral stenosis   Rec:  Move to floor and increase activity.  Continue diuresis, if stable d/c home in am.   W. Ashley Royalty  MD [facility]  01/09/2012, 8:55 AM

## 2012-01-09 NOTE — Progress Notes (Signed)
Report called to Rodney Booze, RN, on [phone].  Pt. To be transferred to telemetry.  Pt. Notified of transfer and informed of change of level of care.

## 2012-01-10 LAB — BASIC METABOLIC PANEL
BUN: 22 mg/dL (ref 6–23)
Chloride: 102 mEq/L (ref 96–112)
Glucose, Bld: 118 mg/dL — ABNORMAL HIGH (ref 70–99)
Potassium: 3.7 mEq/L (ref 3.5–5.1)

## 2012-01-10 LAB — GLUCOSE, CAPILLARY: Glucose-Capillary: 123 mg/dL — ABNORMAL HIGH (ref 70–99)

## 2012-01-10 MED ORDER — FUROSEMIDE 40 MG PO TABS: 40.0000 mg | ORAL_TABLET | Freq: Every day | ORAL | Status: DC

## 2012-01-10 MED ORDER — POTASSIUM CHLORIDE 20 MEQ/15ML (10%) PO LIQD
40.0000 meq | Freq: Once | ORAL | Status: DC
  Filled 2012-01-10: qty 30

## 2012-01-10 MED ORDER — METOPROLOL SUCCINATE ER 50 MG PO TB24: 50.0000 mg | ORAL_TABLET | Freq: Every day | ORAL | Status: DC

## 2012-01-10 MED ORDER — POTASSIUM CHLORIDE CRYS ER 20 MEQ PO TBCR: 20.0000 meq | EXTENDED_RELEASE_TABLET | Freq: Every day | ORAL | Status: DC

## 2012-01-10 NOTE — Discharge Summary (Signed)
Physician Discharge Summary  Patient ID: Elizabeth Nicholson MRN: [id number] DOB/AGE: [birth year] 71 y.o.  Admit date: 01/07/2012 Discharge date: 01/10/2012  Primary Physician: Dr. Merri Brunette Primary Discharge Diagnosis: 1. Acute diastolic congestive heart failure due to mitral stenosis  Secondary Discharge Diagnosis: 2. Mild-to-moderate mitral stenosis 3. Hypertensive heart disease 4. Hypothyroidism 5. Diabetes mellitus 6. History of depression  Procedures::  Right and  left heart catheterization  Hospital Course: The patient came to Dr. Michaelle Copas office for a myocardial perfusion scan but developed acute dyspnea with oxygen to saturations. She was admitted to the hospital with congestive heart failure. It was felt that she would need to have catheterization. She was diuresed significantly and her symptoms improved. Dr. Katrinka Blazing then did cardiac catheterization. Cardiac cath showed : 1. Mild mitral stenosis with no evidence of elevated pulmonary wedge pressures. The mean mitral valve gradient is only 2.3 mmHg. This is after significant diuresis. The left ventricular end-diastolic pressure is also low normal.  2. Widely patent coronary arteries . 3. Normal left ventricular systolic function  Dr. Michaelle Copas opinion was that she had mild/moderate mitral stenosis and should be treated medically with continued diuresis. She was changed over to oral diuretics and her breathing improved. She was able to ream the [facility]. The night prior to discharge she had one 5 beat run of ventricular tachycardia but had no other ventricular arrhythmias noted. Her beta blocker was increased to 50 mg daily.  It was thought at this time that her potassium should be repleted and she should continue on a beta blocker therapy. She will follow up Dr. Katrinka Blazing in one week.  Discharge Exam: Blood pressure 118/67, pulse 60, temperature 98 F (36.7 C), temperature source Oral, resp. rate 18, height 5\' 6"  (1.676 m), weight 86.728 kg  (191 lb 3.2 oz), SpO2 98.00%.   Lungs clear, no S3, cath site clean and dry  Labs: CBC:   Lab Results  Component Value Date   WBC 7.3 01/08/2012   HGB 12.1 01/08/2012   HCT 35.3* 01/08/2012   MCV 89.1 01/08/2012   PLT 184 01/08/2012   CMP:  Lab 01/10/12 0449 01/07/12 1251  NA 138 --  K 3.7 --  CL 102 --  CO2 23 --  BUN 22 --  CREATININE 1.06 --  CALCIUM 9.5 --  PROT -- 7.7  BILITOT -- 0.6  ALKPHOS -- 84  ALT -- 41*  AST -- 35  GLUCOSE 118* --    Cardiac Enzymes:  Basename 01/07/12 1252  CKTOTAL --  CKMB --  CKMBINDEX --  TROPONINI <0.30    Radiology: Infiltrates consistent with congestive heart failure  EKG: Normal EKG  Discharge Medications:  Kearra, Calkin  Home Medication Instructions [id number]   Printed on:01/10/12 1214  Medication Information                    benazepril-hydrochlorthiazide (LOTENSIN HCT) 10-12.5 MG per tablet Take 1 tablet by mouth daily.           citalopram (CELEXA) 20 MG tablet Take 10 mg by mouth daily.           levothyroxine (SYNTHROID, LEVOTHROID) 112 MCG tablet Take 112 mcg by mouth daily.           rosuvastatin (CRESTOR) 5 MG tablet Take 5 mg by mouth every Monday, Wednesday, and Friday.           Krill Oil CAPS Take 1 capsule by mouth daily.  Multiple Minerals (CHELATED MULTIPLE MINERAL PO) Take 2 tablets by mouth daily.           Probiotic Product (PROBIOTIC DAILY PO) Take 1 tablet by mouth daily.           OVER THE COUNTER MEDICATION Take 2 tablets by mouth daily. Mega antioxidant           insulin lispro (HUMALOG) 100 UNIT/ML injection Inject 12-22 Units into the skin 3 (three) times daily before meals. Use 12 units with breakfast and Lunch, and 18 units with dinner. Add 1 additional unit for every 30 points over 100.           insulin glargine (LANTUS) 100 UNIT/ML injection Inject 36 Units into the skin at bedtime.           furosemide (LASIX) 40 MG tablet Take 1 tablet (40 mg total) by  mouth daily.           metoprolol succinate (TOPROL-XL) 50 MG 24 hr tablet Take 1 tablet (50 mg total) by mouth daily. Take with or immediately following a meal.           potassium chloride SA (K-DUR,KLOR-CON) 20 MEQ tablet Take 1 tablet (20 mEq total) by mouth daily.             Followup plans and appointments: Dr. Katrinka Blazing in one week    Time spent with patient to include physician time: 45 minutes Signed: W. Ashley Royalty. MD [facility] 01/10/2012, 12:14 PM

## 2012-01-11 LAB — GLUCOSE, CAPILLARY: Glucose-Capillary: 94 mg/dL (ref 70–99)

## 2012-03-09 ENCOUNTER — Encounter (INDEPENDENT_AMBULATORY_CARE_PROVIDER_SITE_OTHER): Payer: Self-pay | Admitting: General Surgery

## 2012-03-09 ENCOUNTER — Ambulatory Visit (INDEPENDENT_AMBULATORY_CARE_PROVIDER_SITE_OTHER): Payer: Medicare Other | Admitting: General Surgery

## 2012-03-09 VITALS — BP 136/72 | HR 74 | Temp 97.6°F | Ht 66.5 in | Wt 195.2 lb

## 2012-03-09 DIAGNOSIS — K5732 Diverticulitis of large intestine without perforation or abscess without bleeding: Secondary | ICD-10-CM

## 2012-03-09 NOTE — Progress Notes (Signed)
Patient ID: Elizabeth Nicholson, female   DOB: 08/07/1940, 71 y.o.   MRN: [id number]  Chief Complaint  Patient presents with  . Pre-op Exam    eval diverticulitis    HPI Elizabeth Nicholson is a 71 y.o. female.  Recurrent sigmoid diverticulitis HPI Patient history of present years of multiple episodes of sigmoid diverticulitis. These have all been treated as an outpatient. She has had 2 episodes this fall. She is followed by Dr. Katrinka Blazing from cardiology. She recently had an evaluation by him in preparation for possible surgery. She was hospitalized for 3 days. Cardiac catheterization according to the patient was clear. No significant cardiac abnormalities were found according to the patient. She does have some mild mitral stenosis. She's had no further left lower quadrant abdominal pain since completing her last on antibiotics. She did have a mild yeast infection without treatment. Stools have been soft chronically. No blood in stool. Past Medical History  Diagnosis Date  . Hypertension   . Hypothyroidism   . Depression   . Diabetes mellitus   . Heart murmur   . Shortness of breath   . CHF (congestive heart failure)   . Hyperlipidemia     Past Surgical History  Procedure Date  . Cholecystectomy 1998  . Breast surgery [id number]  . Abdominal hysterectomy 12/25/2003    Family History  Problem Relation Age of Onset  . Coronary artery disease Mother   . Diabetes type II Father   . Hypothyroidism Father   . Parkinsonism Father   . Coronary artery disease Brother   . Diabetes type II Brother   . Cancer Maternal Grandmother     breast    Social History History  Substance Use Topics  . Smoking status: Never Smoker   . Smokeless tobacco: Never Used  . Alcohol Use: No    Allergies  Allergen Reactions  . Sulfa Antibiotics Other (See Comments)    Topical sulfa only  . Zoloft (Sertraline Hcl) Other (See Comments)    Negative mindset    Current Outpatient Prescriptions  Medication Sig  Dispense Refill  . benazepril-hydrochlorthiazide (LOTENSIN HCT) 10-12.5 MG per tablet Take 1 tablet by mouth daily.      . citalopram (CELEXA) 20 MG tablet Take 10 mg by mouth daily.      . furosemide (LASIX) 40 MG tablet Take 1 tablet (40 mg total) by mouth daily.  30 tablet  12  . insulin glargine (LANTUS) 100 UNIT/ML injection Inject 36 Units into the skin at bedtime.      . insulin lispro (HUMALOG) 100 UNIT/ML injection Inject 12-22 Units into the skin 3 (three) times daily before meals. Use 12 units with breakfast and Lunch, and 18 units with dinner. Add 1 additional unit for every 30 points over 100.      . irbesartan (AVAPRO) 150 MG tablet       . Krill Oil CAPS Take 1 capsule by mouth daily.      Marland Kitchen levothyroxine (SYNTHROID, LEVOTHROID) 112 MCG tablet Take 112 mcg by mouth daily.      . metoprolol succinate (TOPROL-XL) 50 MG 24 hr tablet Take 1 tablet (50 mg total) by mouth daily. Take with or immediately following a meal.  30 tablet  12  . Multiple Minerals (CHELATED MULTIPLE MINERAL PO) Take 2 tablets by mouth daily.      . ONE TOUCH ULTRA TEST test strip       . ONETOUCH DELICA LANCETS MISC       .  OVER THE COUNTER MEDICATION Take 2 tablets by mouth daily. Mega antioxidant      . potassium chloride SA (K-DUR,KLOR-CON) 20 MEQ tablet Take 1 tablet (20 mEq total) by mouth daily.  30 tablet  12  . Probiotic Product (PROBIOTIC DAILY PO) Take 1 tablet by mouth daily.      . rosuvastatin (CRESTOR) 5 MG tablet Take 5 mg by mouth every Monday, Wednesday, and Friday.        Review of Systems Review of Systems  Constitutional: Negative for fever, chills and unexpected weight change.  HENT: Negative for hearing loss, congestion, sore throat, trouble swallowing and voice change.   Eyes: Negative for visual disturbance.  Respiratory: Negative for cough, chest tightness and wheezing.   Cardiovascular: Negative for chest pain, palpitations and leg swelling.  Gastrointestinal: Positive for  abdominal pain. Negative for nausea, vomiting, diarrhea, constipation, blood in stool, abdominal distention and anal bleeding.       See history of present illness, abdominal pain has resolved  Genitourinary: Negative for hematuria, vaginal bleeding and difficulty urinating.  Musculoskeletal: Negative for arthralgias.  Skin: Negative for rash and wound.  Neurological: Negative for seizures, syncope and headaches.  Hematological: Negative for adenopathy. Does not bruise/bleed easily.  Psychiatric/Behavioral: Negative for confusion.    Blood pressure 136/72, pulse 74, temperature 97.6 F (36.4 C), temperature source Temporal, height 5' 6.5" (1.689 m), weight 195 lb 3.2 oz (88.542 kg), SpO2 98.00%.  Physical Exam Physical Exam  Constitutional: She is oriented to person, place, and time. She appears well-developed and well-nourished.  HENT:  Head: Normocephalic and atraumatic.  Eyes: EOM are normal. Pupils are equal, round, and reactive to light.  Neck: Normal range of motion. Neck supple. No tracheal deviation present.  Cardiovascular: Normal rate, regular rhythm, normal heart sounds and intact distal pulses.   Pulmonary/Chest: Effort normal. No stridor. No respiratory distress. She has no wheezes. She has no rales.  Abdominal: Soft. She exhibits no distension and no mass. There is no tenderness. There is no rebound and no guarding.  Musculoskeletal: Normal range of motion.  Neurological: She is alert and oriented to person, place, and time.  Skin: Skin is warm.    Data Reviewed I discussed with Dr. Ewing Schlein including colonoscopy reports and CT scan findings  Assessment    Recurrent sigmoid diverticulitis with multiple episodes    Plan    I agree with Dr. Marlane Hatcher assessment. I've offered sigmoid colectomy. I've offered an open approach to fully palpate and evaluate her colon to remove the entire diseased segment. We discussed this in detail. We discussed the risks, benefits, and  discussed the procedure in detail. She will need to undergo a bowel prep. Prior to scheduling, we need to have documentation from Dr. Katrinka Blazing regarding her cardiac clearance. Once we obtain that, we will plan to schedule. I answered her questions.       Demeshia Sherburne E 03/09/2012, 10:03 AM

## 2012-03-22 ENCOUNTER — Encounter (INDEPENDENT_AMBULATORY_CARE_PROVIDER_SITE_OTHER): Payer: Self-pay

## 2012-03-22 ENCOUNTER — Other Ambulatory Visit: Payer: Self-pay | Admitting: General Surgery

## 2012-03-25 ENCOUNTER — Telehealth (INDEPENDENT_AMBULATORY_CARE_PROVIDER_SITE_OTHER): Payer: Self-pay

## 2012-03-25 NOTE — Telephone Encounter (Signed)
Message copied by Ivory Broad on Fri Mar 25, 2012  2:12 PM ------      Message from: Matilde Sprang      Created: Fri Mar 25, 2012  2:07 PM      Regarding: cardiac clearance      Contact: [phone]       Pt wants to know status of cardiac clearance.  Please call her. bp

## 2012-03-25 NOTE — Telephone Encounter (Signed)
I called the pt to let her know we already rec'd cardiac clearance and the schedulers have the orders.

## 2012-04-26 ENCOUNTER — Encounter (HOSPITAL_COMMUNITY)
Admission: RE | Admit: 2012-04-26 | Discharge: 2012-04-26 | Disposition: A | Discharge status: Home / Self Care | Payer: Medicare Other | Source: Ambulatory Visit | Attending: General Surgery | Admitting: General Surgery

## 2012-04-26 ENCOUNTER — Encounter (HOSPITAL_COMMUNITY): Payer: Self-pay

## 2012-04-26 LAB — BASIC METABOLIC PANEL
BUN: 23 mg/dL (ref 6–23)
CO2: 26 mEq/L (ref 19–32)
Calcium: 9.5 mg/dL (ref 8.4–10.5)
Glucose, Bld: 151 mg/dL — ABNORMAL HIGH (ref 70–99)
Sodium: 139 mEq/L (ref 135–145)

## 2012-04-26 LAB — CBC
Hemoglobin: 13.7 g/dL (ref 12.0–15.0)
MCH: 31.7 pg (ref 26.0–34.0)
MCV: 91 fL (ref 78.0–100.0)
Platelets: 206 10*3/uL (ref 150–400)
RBC: 4.32 MIL/uL (ref 3.87–5.11)

## 2012-04-26 MED ORDER — CHLORHEXIDINE GLUCONATE 4 % EX LIQD: 1.0000 "application " | Freq: Once | CUTANEOUS | Status: DC

## 2012-04-26 NOTE — Pre-Procedure Instructions (Signed)
Elizabeth Nicholson  04/26/2012   Your procedure is scheduled on:  Monday May 02, 2012  Report to [facility] at 0900 AM.  Call this number if you have problems the morning of surgery: [phone]   Remember:   Do not eat food or drink liquids after midnight.   Take these medicines the morning of surgery with A SIP OF WATER: Synthroid,  Metoprolol,    Do not wear jewelry, make-up or nail polish.  Do not wear lotions, powders, or perfumes. You may wear deodorant.  Do not shave 48 hours prior to surgery.   Do not bring valuables to the hospital.  Contacts, dentures or bridgework may not be worn into surgery.  Leave suitcase in the car. After surgery it may be brought to your room.  For patients admitted to the hospital, checkout time is 11:00 AM the day of  discharge.   Patients discharged the day of surgery will not be allowed to drive  home.    Special Instructions: Shower using CHG 2 nights before surgery and the night before surgery.  If you shower the day of surgery use CHG.  Use special wash - you have one bottle of CHG for all showers.  You should use approximately 1/3 of the bottle for each shower.   Please read over the following fact sheets that you were given: Pain Booklet, Coughing and Deep Breathing, MRSA Information and Surgical Site Infection Prevention

## 2012-04-28 ENCOUNTER — Encounter (HOSPITAL_COMMUNITY): Payer: Self-pay | Admitting: Pharmacy Technician

## 2012-05-01 MED ORDER — ALVIMOPAN 12 MG PO CAPS
12.0000 mg | ORAL_CAPSULE | Freq: Once | ORAL | Status: AC
  Administered 2012-05-02: 12 mg via ORAL
  Filled 2012-05-01: qty 1

## 2012-05-01 MED ORDER — DEXTROSE 5 % IV SOLN
2.0000 g | INTRAVENOUS | Status: AC
  Administered 2012-05-02: 2 g via INTRAVENOUS
  Filled 2012-05-01: qty 2

## 2012-05-02 ENCOUNTER — Encounter (HOSPITAL_COMMUNITY): Payer: Self-pay | Admitting: Certified Registered Nurse Anesthetist

## 2012-05-02 ENCOUNTER — Encounter (HOSPITAL_COMMUNITY): Payer: Self-pay | Admitting: *Deleted

## 2012-05-02 ENCOUNTER — Inpatient Hospital Stay (HOSPITAL_COMMUNITY)
Admission: RE | Admit: 2012-05-02 | Discharge: 2012-05-07 | DRG: 329 | Disposition: A | Discharge status: Home / Self Care | Payer: Medicare Other | Source: Ambulatory Visit | Attending: General Surgery | Admitting: General Surgery

## 2012-05-02 ENCOUNTER — Encounter (HOSPITAL_COMMUNITY)
Admission: RE | Disposition: A | Discharge status: Home / Self Care | Payer: Self-pay | Source: Ambulatory Visit | Attending: General Surgery

## 2012-05-02 ENCOUNTER — Inpatient Hospital Stay (HOSPITAL_COMMUNITY): Payer: Medicare Other | Admitting: Certified Registered Nurse Anesthetist

## 2012-05-02 ENCOUNTER — Inpatient Hospital Stay (HOSPITAL_COMMUNITY): Payer: Medicare Other

## 2012-05-02 DIAGNOSIS — K5732 Diverticulitis of large intestine without perforation or abscess without bleeding: Principal | ICD-10-CM | POA: Diagnosis present

## 2012-05-02 DIAGNOSIS — Z8719 Personal history of other diseases of the digestive system: Secondary | ICD-10-CM

## 2012-05-02 DIAGNOSIS — E876 Hypokalemia: Secondary | ICD-10-CM

## 2012-05-02 DIAGNOSIS — K5730 Diverticulosis of large intestine without perforation or abscess without bleeding: Secondary | ICD-10-CM

## 2012-05-02 DIAGNOSIS — K560 Paralytic ileus: Secondary | ICD-10-CM | POA: Diagnosis present

## 2012-05-02 DIAGNOSIS — I509 Heart failure, unspecified: Secondary | ICD-10-CM | POA: Diagnosis present

## 2012-05-02 DIAGNOSIS — E785 Hyperlipidemia, unspecified: Secondary | ICD-10-CM | POA: Diagnosis present

## 2012-05-02 DIAGNOSIS — J810 Acute pulmonary edema: Secondary | ICD-10-CM

## 2012-05-02 DIAGNOSIS — E119 Type 2 diabetes mellitus without complications: Secondary | ICD-10-CM | POA: Diagnosis present

## 2012-05-02 DIAGNOSIS — J9600 Acute respiratory failure, unspecified whether with hypoxia or hypercapnia: Secondary | ICD-10-CM

## 2012-05-02 DIAGNOSIS — I10 Essential (primary) hypertension: Secondary | ICD-10-CM | POA: Diagnosis present

## 2012-05-02 DIAGNOSIS — E039 Hypothyroidism, unspecified: Secondary | ICD-10-CM | POA: Diagnosis present

## 2012-05-02 DIAGNOSIS — I5031 Acute diastolic (congestive) heart failure: Secondary | ICD-10-CM

## 2012-05-02 DIAGNOSIS — R0902 Hypoxemia: Secondary | ICD-10-CM

## 2012-05-02 HISTORY — PX: COLOSTOMY REVISION: SHX5232

## 2012-05-02 LAB — BLOOD GAS, ARTERIAL
Acid-base deficit: 6 mmol/L — ABNORMAL HIGH (ref 0.0–2.0)
Bicarbonate: 19.2 mEq/L — ABNORMAL LOW (ref 20.0–24.0)
Drawn by: 28337
FIO2: 100 %
FIO2: 1 %
O2 Saturation: 99.8 %
PEEP: 5 cmH2O
PEEP: 5 cmH2O
Patient temperature: 98.6
RATE: 14 resp/min
pCO2 arterial: 50.6 mmHg — ABNORMAL HIGH (ref 35.0–45.0)
pH, Arterial: 7.233 — ABNORMAL LOW (ref 7.350–7.450)
pO2, Arterial: 95.2 mmHg (ref 80.0–100.0)

## 2012-05-02 LAB — GLUCOSE, CAPILLARY
Glucose-Capillary: 151 mg/dL — ABNORMAL HIGH (ref 70–99)
Glucose-Capillary: 165 mg/dL — ABNORMAL HIGH (ref 70–99)
Glucose-Capillary: 193 mg/dL — ABNORMAL HIGH (ref 70–99)

## 2012-05-02 LAB — TROPONIN I
Troponin I: <0.3 ng/mL (ref <0.30)
Troponin I: <0.3 ng/mL (ref <0.30)

## 2012-05-02 SURGERY — COLECTOMY, SIGMOID, OPEN
Anesthesia: General | Site: Abdomen | Wound class: Clean Contaminated

## 2012-05-02 MED ORDER — ONDANSETRON HCL 4 MG/2ML IJ SOLN
INTRAMUSCULAR | Status: DC | PRN
  Administered 2012-05-02: 4 mg via INTRAVENOUS

## 2012-05-02 MED ORDER — NALOXONE HCL 0.4 MG/ML IJ SOLN
0.4000 mg | INTRAMUSCULAR | Status: DC | PRN
  Filled 2012-05-02: qty 1

## 2012-05-02 MED ORDER — KCL IN DEXTROSE-NACL 20-5-0.45 MEQ/L-%-% IV SOLN
INTRAVENOUS | Status: DC
  Administered 2012-05-02: 125 mL via INTRAVENOUS
  Filled 2012-05-02 (×3): qty 1000

## 2012-05-02 MED ORDER — DIPHENHYDRAMINE HCL 50 MG/ML IJ SOLN
12.5000 mg | Freq: Four times a day (QID) | INTRAMUSCULAR | Status: DC | PRN
  Filled 2012-05-02: qty 0.25

## 2012-05-02 MED ORDER — OXYCODONE HCL 5 MG PO TABS
5.0000 mg | ORAL_TABLET | Freq: Once | ORAL | Status: DC | PRN
Start: 2012-05-02 — End: 2012-05-02

## 2012-05-02 MED ORDER — PROPOFOL 10 MG/ML IV BOLUS
INTRAVENOUS | Status: DC | PRN
  Administered 2012-05-02: 200 mg via INTRAVENOUS

## 2012-05-02 MED ORDER — FUROSEMIDE 10 MG/ML IJ SOLN: 40.0000 mg | INTRAMUSCULAR | Status: DC

## 2012-05-02 MED ORDER — INSULIN ASPART 100 UNIT/ML ~~LOC~~ SOLN
0.0000 [IU] | SUBCUTANEOUS | Status: DC
  Administered 2012-05-02: 8 [IU] via SUBCUTANEOUS
  Administered 2012-05-02: 3 [IU] via SUBCUTANEOUS
  Administered 2012-05-03: 2 [IU] via SUBCUTANEOUS
  Administered 2012-05-03 (×2): 3 [IU] via SUBCUTANEOUS
  Administered 2012-05-03: 2 [IU] via SUBCUTANEOUS
  Administered 2012-05-03: 3 [IU] via SUBCUTANEOUS
  Administered 2012-05-03 – 2012-05-04 (×3): 2 [IU] via SUBCUTANEOUS
  Administered 2012-05-04 (×2): 3 [IU] via SUBCUTANEOUS
  Administered 2012-05-04: 2 [IU] via SUBCUTANEOUS
  Administered 2012-05-05: 3 [IU] via SUBCUTANEOUS
  Administered 2012-05-05: 2 [IU] via SUBCUTANEOUS
  Administered 2012-05-05: 8 [IU] via SUBCUTANEOUS
  Administered 2012-05-05 (×3): 2 [IU] via SUBCUTANEOUS
  Administered 2012-05-05: 3 [IU] via SUBCUTANEOUS
  Administered 2012-05-06 (×2): 2 [IU] via SUBCUTANEOUS
  Administered 2012-05-06: 3 [IU] via SUBCUTANEOUS
  Administered 2012-05-06: 2 [IU] via SUBCUTANEOUS
  Administered 2012-05-06 (×2): 5 [IU] via SUBCUTANEOUS
  Administered 2012-05-07 (×2): 3 [IU] via SUBCUTANEOUS

## 2012-05-02 MED ORDER — METOPROLOL SUCCINATE ER 50 MG PO TB24
50.0000 mg | ORAL_TABLET | Freq: Every day | ORAL | Status: DC
  Filled 2012-05-02: qty 1

## 2012-05-02 MED ORDER — SODIUM CHLORIDE 0.9 % IJ SOLN: 9.0000 mL | INTRAMUSCULAR | Status: DC | PRN

## 2012-05-02 MED ORDER — PROPOFOL 10 MG/ML IV EMUL
5.0000 ug/kg/min | INTRAVENOUS | Status: AC
  Administered 2012-05-02: 9.198 ug/kg/min via INTRAVENOUS
  Filled 2012-05-02: qty 100

## 2012-05-02 MED ORDER — ROCURONIUM BROMIDE 100 MG/10ML IV SOLN
INTRAVENOUS | Status: DC | PRN
  Administered 2012-05-02: 50 mg via INTRAVENOUS
  Administered 2012-05-02: 30 mg via INTRAVENOUS

## 2012-05-02 MED ORDER — KCL IN DEXTROSE-NACL 20-5-0.45 MEQ/L-%-% IV SOLN
INTRAVENOUS | Status: AC
  Filled 2012-05-02: qty 1000

## 2012-05-02 MED ORDER — FENTANYL CITRATE 0.05 MG/ML IJ SOLN
INTRAMUSCULAR | Status: AC
  Administered 2012-05-02: 100 ug
  Filled 2012-05-02: qty 2

## 2012-05-02 MED ORDER — POTASSIUM CHLORIDE 10 MEQ/100ML IV SOLN
10.0000 meq | INTRAVENOUS | Status: AC
  Administered 2012-05-02 (×4): 10 meq via INTRAVENOUS
  Filled 2012-05-02: qty 300
  Filled 2012-05-02: qty 100

## 2012-05-02 MED ORDER — ONDANSETRON HCL 4 MG/2ML IJ SOLN: 4.0000 mg | Freq: Four times a day (QID) | INTRAMUSCULAR | Status: DC | PRN

## 2012-05-02 MED ORDER — SODIUM CHLORIDE 0.9 % IV SOLN
INTRAVENOUS | Status: DC
  Administered 2012-05-02: 500 mL via INTRAVENOUS
  Administered 2012-05-04 – 2012-05-05 (×2): via INTRAVENOUS

## 2012-05-02 MED ORDER — PHENYLEPHRINE HCL 10 MG/ML IJ SOLN
INTRAMUSCULAR | Status: DC | PRN
  Administered 2012-05-02 (×2): 80 ug via INTRAVENOUS
  Administered 2012-05-02: 40 ug via INTRAVENOUS
  Administered 2012-05-02: 80 ug via INTRAVENOUS
  Administered 2012-05-02: 40 ug via INTRAVENOUS
  Administered 2012-05-02 (×3): 80 ug via INTRAVENOUS

## 2012-05-02 MED ORDER — LACTATED RINGERS IV SOLN
INTRAVENOUS | Status: DC | PRN
  Administered 2012-05-02: 11:00:00 via INTRAVENOUS

## 2012-05-02 MED ORDER — GLYCOPYRROLATE 0.2 MG/ML IJ SOLN
INTRAMUSCULAR | Status: DC | PRN
  Administered 2012-05-02: 0.6 mg via INTRAVENOUS

## 2012-05-02 MED ORDER — PROPOFOL 10 MG/ML IV EMUL
5.0000 ug/kg/min | INTRAVENOUS | Status: DC
  Administered 2012-05-02 – 2012-05-03 (×3): 50 ug/kg/min via INTRAVENOUS
  Administered 2012-05-03: 20 ug/kg/min via INTRAVENOUS
  Filled 2012-05-02 (×4): qty 100

## 2012-05-02 MED ORDER — FUROSEMIDE 10 MG/ML IJ SOLN
40.0000 mg | INTRAMUSCULAR | Status: AC
  Administered 2012-05-02 (×2): 40 mg via INTRAVENOUS
  Filled 2012-05-02 (×2): qty 4

## 2012-05-02 MED ORDER — ALBUTEROL SULFATE (5 MG/ML) 0.5% IN NEBU
2.5000 mg | INHALATION_SOLUTION | Freq: Once | RESPIRATORY_TRACT | Status: AC
  Administered 2012-05-02: 2.5 mg via RESPIRATORY_TRACT

## 2012-05-02 MED ORDER — SODIUM CHLORIDE 0.9 % IV SOLN
25.0000 ug/h | INTRAVENOUS | Status: DC
  Administered 2012-05-02: 25 ug/h via INTRAVENOUS
  Filled 2012-05-02: qty 50

## 2012-05-02 MED ORDER — FENTANYL BOLUS VIA INFUSION
25.0000 ug | Freq: Four times a day (QID) | INTRAVENOUS | Status: DC | PRN
  Filled 2012-05-02: qty 100

## 2012-05-02 MED ORDER — HEPARIN SODIUM (PORCINE) 5000 UNIT/ML IJ SOLN
5000.0000 [IU] | Freq: Three times a day (TID) | INTRAMUSCULAR | Status: DC
  Administered 2012-05-02 – 2012-05-07 (×14): 5000 [IU] via SUBCUTANEOUS
  Filled 2012-05-02 (×17): qty 1

## 2012-05-02 MED ORDER — NEOSTIGMINE METHYLSULFATE 1 MG/ML IJ SOLN
INTRAMUSCULAR | Status: DC | PRN
  Administered 2012-05-02: 4 mg via INTRAVENOUS

## 2012-05-02 MED ORDER — FENTANYL CITRATE 0.05 MG/ML IJ SOLN
INTRAMUSCULAR | Status: DC | PRN
  Administered 2012-05-02 (×2): 50 ug via INTRAVENOUS
  Administered 2012-05-02: 25 ug via INTRAVENOUS
  Administered 2012-05-02: 50 ug via INTRAVENOUS
  Administered 2012-05-02: 150 ug via INTRAVENOUS
  Administered 2012-05-02: 50 ug via INTRAVENOUS

## 2012-05-02 MED ORDER — LACTATED RINGERS IV SOLN
INTRAVENOUS | Status: DC | PRN
  Administered 2012-05-02 (×2): via INTRAVENOUS

## 2012-05-02 MED ORDER — POTASSIUM CHLORIDE CRYS ER 20 MEQ PO TBCR: 20.0000 meq | EXTENDED_RELEASE_TABLET | Freq: Every day | ORAL | Status: DC

## 2012-05-02 MED ORDER — ALBUTEROL SULFATE (5 MG/ML) 0.5% IN NEBU
INHALATION_SOLUTION | RESPIRATORY_TRACT | Status: AC
  Administered 2012-05-02: 2.5 mg via RESPIRATORY_TRACT
  Filled 2012-05-02: qty 0.5

## 2012-05-02 MED ORDER — OXYCODONE HCL 5 MG/5ML PO SOLN: 5.0000 mg | Freq: Once | ORAL | Status: DC | PRN

## 2012-05-02 MED ORDER — 0.9 % SODIUM CHLORIDE (POUR BTL) OPTIME
TOPICAL | Status: DC | PRN
  Administered 2012-05-02 (×4): 1000 mL

## 2012-05-02 MED ORDER — HYDROMORPHONE 0.3 MG/ML IV SOLN: INTRAVENOUS | Status: DC

## 2012-05-02 MED ORDER — LIDOCAINE HCL (CARDIAC) 20 MG/ML IV SOLN
INTRAVENOUS | Status: DC | PRN
  Administered 2012-05-02: 80 mg via INTRAVENOUS

## 2012-05-02 MED ORDER — HYDROMORPHONE HCL PF 1 MG/ML IJ SOLN: 0.2500 mg | INTRAMUSCULAR | Status: DC | PRN

## 2012-05-02 MED ORDER — IRBESARTAN 150 MG PO TABS
150.0000 mg | ORAL_TABLET | Freq: Every day | ORAL | Status: DC
  Administered 2012-05-03 – 2012-05-07 (×5): 150 mg via ORAL
  Filled 2012-05-02 (×5): qty 1

## 2012-05-02 MED ORDER — LEVOTHYROXINE SODIUM 112 MCG PO TABS
112.0000 ug | ORAL_TABLET | Freq: Every day | ORAL | Status: DC
  Filled 2012-05-02 (×2): qty 1

## 2012-05-02 MED ORDER — METOCLOPRAMIDE HCL 5 MG/ML IJ SOLN: 10.0000 mg | Freq: Once | INTRAMUSCULAR | Status: DC | PRN

## 2012-05-02 MED ORDER — ALVIMOPAN 12 MG PO CAPS
12.0000 mg | ORAL_CAPSULE | Freq: Two times a day (BID) | ORAL | Status: DC
  Administered 2012-05-03 – 2012-05-05 (×6): 12 mg via ORAL
  Filled 2012-05-02 (×8): qty 1

## 2012-05-02 MED ORDER — DIPHENHYDRAMINE HCL 12.5 MG/5ML PO ELIX
12.5000 mg | ORAL_SOLUTION | Freq: Four times a day (QID) | ORAL | Status: DC | PRN
  Filled 2012-05-02: qty 5

## 2012-05-02 MED ORDER — FUROSEMIDE 10 MG/ML IJ SOLN
INTRAMUSCULAR | Status: AC
  Administered 2012-05-02: 10 mg
  Filled 2012-05-02: qty 4

## 2012-05-02 MED ORDER — ONDANSETRON HCL 4 MG PO TABS: 4.0000 mg | ORAL_TABLET | Freq: Four times a day (QID) | ORAL | Status: DC | PRN

## 2012-05-02 MED ORDER — PANTOPRAZOLE SODIUM 40 MG IV SOLR
40.0000 mg | Freq: Every day | INTRAVENOUS | Status: DC
  Administered 2012-05-02 – 2012-05-05 (×4): 40 mg via INTRAVENOUS
  Filled 2012-05-02 (×5): qty 40

## 2012-05-02 MED ORDER — ONDANSETRON HCL 4 MG/2ML IJ SOLN
4.0000 mg | Freq: Four times a day (QID) | INTRAMUSCULAR | Status: DC | PRN
  Filled 2012-05-02: qty 2

## 2012-05-02 MED ORDER — LABETALOL HCL 5 MG/ML IV SOLN
INTRAVENOUS | Status: AC
  Administered 2012-05-02: 10 mg
  Filled 2012-05-02: qty 4

## 2012-05-02 MED ORDER — FUROSEMIDE 40 MG PO TABS
40.0000 mg | ORAL_TABLET | Freq: Every day | ORAL | Status: DC
  Filled 2012-05-02: qty 1

## 2012-05-02 MED ORDER — MIDAZOLAM HCL 5 MG/5ML IJ SOLN
INTRAMUSCULAR | Status: DC | PRN
  Administered 2012-05-02: 2 mg via INTRAVENOUS

## 2012-05-02 SURGICAL SUPPLY — 58 items
BLADE SURG ROTATE 9660 (MISCELLANEOUS) IMPLANT
CANISTER SUCTION 2500CC (MISCELLANEOUS) ×2 IMPLANT
CHLORAPREP W/TINT 26ML (MISCELLANEOUS) ×2 IMPLANT
CLOTH BEACON ORANGE TIMEOUT ST (SAFETY) ×2 IMPLANT
COVER MAYO STAND STRL (DRAPES) ×2 IMPLANT
COVER SURGICAL LIGHT HANDLE (MISCELLANEOUS) ×2 IMPLANT
DRAPE LAPAROSCOPIC ABDOMINAL (DRAPES) ×2 IMPLANT
DRAPE PROXIMA HALF (DRAPES) ×2 IMPLANT
DRAPE UTILITY 15X26 W/TAPE STR (DRAPE) ×6 IMPLANT
DRAPE WARM FLUID 44X44 (DRAPE) ×2 IMPLANT
ELECT BLADE 6.5 EXT (BLADE) ×2 IMPLANT
ELECT CAUTERY BLADE 6.4 (BLADE) ×4 IMPLANT
ELECT REM PT RETURN 9FT ADLT (ELECTROSURGICAL) ×2
ELECTRODE REM PT RTRN 9FT ADLT (ELECTROSURGICAL) ×1 IMPLANT
GEL ULTRASOUND 20GR AQUASONIC (MISCELLANEOUS) ×1 IMPLANT
GLOVE BIO SURGEON STRL SZ 6.5 (GLOVE) ×2 IMPLANT
GLOVE BIO SURGEON STRL SZ7.5 (GLOVE) ×3 IMPLANT
GLOVE BIO SURGEON STRL SZ8 (GLOVE) ×4 IMPLANT
GLOVE BIOGEL PI IND STRL 7.0 (GLOVE) IMPLANT
GLOVE BIOGEL PI IND STRL 7.5 (GLOVE) IMPLANT
GLOVE BIOGEL PI IND STRL 8 (GLOVE) ×2 IMPLANT
GLOVE BIOGEL PI INDICATOR 7.0 (GLOVE) ×2
GLOVE BIOGEL PI INDICATOR 7.5 (GLOVE) ×3
GLOVE BIOGEL PI INDICATOR 8 (GLOVE) ×3
GLOVE SS BIOGEL STRL SZ 6.5 (GLOVE) IMPLANT
GLOVE SUPERSENSE BIOGEL SZ 6.5 (GLOVE) ×2
GLOVE SURG ORTHO 8.0 STRL STRW (GLOVE) ×2 IMPLANT
GOWN PREVENTION PLUS XLARGE (GOWN DISPOSABLE) ×6 IMPLANT
GOWN STRL NON-REIN LRG LVL3 (GOWN DISPOSABLE) ×7 IMPLANT
KIT BASIN OR (CUSTOM PROCEDURE TRAY) ×2 IMPLANT
KIT ROOM TURNOVER OR (KITS) ×2 IMPLANT
LEGGING LITHOTOMY PAIR STRL (DRAPES) ×2 IMPLANT
LIGASURE IMPACT 36 18CM CVD LR (INSTRUMENTS) ×1 IMPLANT
NS IRRIG 1000ML POUR BTL (IV SOLUTION) ×6 IMPLANT
PACK GENERAL/GYN (CUSTOM PROCEDURE TRAY) ×2 IMPLANT
PAD ARMBOARD 7.5X6 YLW CONV (MISCELLANEOUS) ×2 IMPLANT
PAD SHARPS MAGNETIC DISPOSAL (MISCELLANEOUS) ×2 IMPLANT
SPONGE LAP 18X18 X RAY DECT (DISPOSABLE) ×2 IMPLANT
STAPLER CIRC ILS CVD 25MM (STAPLE) ×1 IMPLANT
STAPLER CUT CVD 40MM BLUE (STAPLE) ×1 IMPLANT
STAPLER PROXIMATE 75MM BLUE (STAPLE) ×1 IMPLANT
STAPLER VISISTAT 35W (STAPLE) ×2 IMPLANT
SUCTION POOLE TIP (SUCTIONS) ×2 IMPLANT
SURGILUBE 2OZ TUBE FLIPTOP (MISCELLANEOUS) ×1 IMPLANT
SUT PDS AB 1 TP1 96 (SUTURE) ×4 IMPLANT
SUT PROLENE 2 0 CT2 30 (SUTURE) IMPLANT
SUT PROLENE 2 0 KS (SUTURE) IMPLANT
SUT SILK 2 0 SH CR/8 (SUTURE) ×2 IMPLANT
SUT SILK 2 0 TIES 10X30 (SUTURE) ×2 IMPLANT
SUT SILK 3 0 SH CR/8 (SUTURE) ×2 IMPLANT
SUT SILK 3 0 TIES 10X30 (SUTURE) ×2 IMPLANT
SUT VIC AB 3-0 SH 18 (SUTURE) IMPLANT
SYR BULB IRRIGATION 50ML (SYRINGE) ×2 IMPLANT
TOWEL OR 17X26 10 PK STRL BLUE (TOWEL DISPOSABLE) ×4 IMPLANT
TRAY FOLEY CATH 14FRSI W/METER (CATHETERS) ×2 IMPLANT
TRAY PROCTOSCOPIC FIBER OPTIC (SET/KITS/TRAYS/PACK) ×1 IMPLANT
WATER STERILE IRR 1000ML POUR (IV SOLUTION) IMPLANT
YANKAUER SUCT BULB TIP NO VENT (SUCTIONS) ×2 IMPLANT

## 2012-05-02 NOTE — H&P (Signed)
Chief Complaint   Patient presents with   .  Pre-op Exam     eval diverticulitis    HPI  Elizabeth Nicholson is a 72 y.o. female. Recurrent sigmoid diverticulitis  HPI  Patient history of present years of multiple episodes of sigmoid diverticulitis. These have all been treated as an outpatient. She has had 2 episodes this fall. She is followed by Dr. Katrinka Blazing from cardiology. She recently had an evaluation by him in preparation for possible surgery. She was hospitalized for 3 days. Cardiac catheterization according to the patient was clear. No significant cardiac abnormalities were found according to the patient. She does have some mild mitral stenosis. She's had no further left lower quadrant abdominal pain since completing her last on antibiotics. She did have a mild yeast infection without treatment. Stools have been soft chronically. No blood in stool.  Past Medical History   Diagnosis  Date   .  Hypertension    .  Hypothyroidism    .  Depression    .  Diabetes mellitus    .  Heart murmur    .  Shortness of breath    .  CHF (congestive heart failure)    .  Hyperlipidemia     Past Surgical History   Procedure  Date   .  Cholecystectomy  1998   .  Breast surgery  [id number]   .  Abdominal hysterectomy  12/25/2003    Family History   Problem  Relation  Age of Onset   .  Coronary artery disease  Mother    .  Diabetes type II  Father    .  Hypothyroidism  Father    .  Parkinsonism  Father    .  Coronary artery disease  Brother    .  Diabetes type II  Brother    .  Cancer  Maternal Grandmother       breast    Social History  History   Substance Use Topics   .  Smoking status:  Never Smoker   .  Smokeless tobacco:  Never Used   .  Alcohol Use:  No    Allergies   Allergen  Reactions   .  Sulfa Antibiotics  Other (See Comments)     Topical sulfa only   .  Zoloft (Sertraline Hcl)  Other (See Comments)     Negative mindset    Current Outpatient Prescriptions   Medication  Sig   Dispense  Refill   .  benazepril-hydrochlorthiazide (LOTENSIN HCT) 10-12.5 MG per tablet  Take 1 tablet by mouth daily.     .  citalopram (CELEXA) 20 MG tablet  Take 10 mg by mouth daily.     .  furosemide (LASIX) 40 MG tablet  Take 1 tablet (40 mg total) by mouth daily.  30 tablet  12   .  insulin glargine (LANTUS) 100 UNIT/ML injection  Inject 36 Units into the skin at bedtime.     .  insulin lispro (HUMALOG) 100 UNIT/ML injection  Inject 12-22 Units into the skin 3 (three) times daily before meals. Use 12 units with breakfast and Lunch, and 18 units with dinner. Add 1 additional unit for every 30 points over 100.     .  irbesartan (AVAPRO) 150 MG tablet      .  Krill Oil CAPS  Take 1 capsule by mouth daily.     Marland Kitchen  levothyroxine (SYNTHROID, LEVOTHROID) 112 MCG tablet  Take 112 mcg  by mouth daily.     .  metoprolol succinate (TOPROL-XL) 50 MG 24 hr tablet  Take 1 tablet (50 mg total) by mouth daily. Take with or immediately following a meal.  30 tablet  12   .  Multiple Minerals (CHELATED MULTIPLE MINERAL PO)  Take 2 tablets by mouth daily.     .  ONE TOUCH ULTRA TEST test strip      .  ONETOUCH DELICA LANCETS MISC      .  OVER THE COUNTER MEDICATION  Take 2 tablets by mouth daily. Mega antioxidant     .  potassium chloride SA (K-DUR,KLOR-CON) 20 MEQ tablet  Take 1 tablet (20 mEq total) by mouth daily.  30 tablet  12   .  Probiotic Product (PROBIOTIC DAILY PO)  Take 1 tablet by mouth daily.     .  rosuvastatin (CRESTOR) 5 MG tablet  Take 5 mg by mouth every Monday, Wednesday, and Friday.      Review of Systems  Review of Systems  Constitutional: Negative for fever, chills and unexpected weight change.  HENT: Negative for hearing loss, congestion, sore throat, trouble swallowing and voice change.  Eyes: Negative for visual disturbance.  Respiratory: Negative for cough, chest tightness and wheezing.  Cardiovascular: Negative for chest pain, palpitations and leg swelling.  Gastrointestinal:  Positive for abdominal pain. Negative for nausea, vomiting, diarrhea, constipation, blood in stool, abdominal distention and anal bleeding.  See history of present illness, abdominal pain has resolved  Genitourinary: Negative for hematuria, vaginal bleeding and difficulty urinating.  Musculoskeletal: Negative for arthralgias.  Skin: Negative for rash and wound.  Neurological: Negative for seizures, syncope and headaches.  Hematological: Negative for adenopathy. Does not bruise/bleed easily.  Psychiatric/Behavioral: Negative for confusion.   Blood pressure 136/72, pulse 74, temperature 97.6 F (36.4 C), temperature source Temporal, height 5' 6.5" (1.689 m), weight 195 lb 3.2 oz (88.542 kg), SpO2 98.00%.  Physical Exam  Physical Exam  Constitutional: She is oriented to person, place, and time. She appears well-developed and well-nourished.  HENT:  Head: Normocephalic and atraumatic.  Eyes: EOM are normal. Pupils are equal, round, and reactive to light.  Neck: Normal range of motion. Neck supple. No tracheal deviation present.  Cardiovascular: Normal rate, regular rhythm, normal heart sounds and intact distal pulses.  Pulmonary/Chest: Effort normal. No stridor. No respiratory distress. She has no wheezes. She has no rales.  Abdominal: Soft. She exhibits no distension and no mass. There is no tenderness. There is no rebound and no guarding.  Musculoskeletal: Normal range of motion.  Neurological: She is alert and oriented to person, place, and time.  Skin: Skin is warm.   Data Reviewed  I discussed with Dr. Ewing Schlein including colonoscopy reports and CT scan findings  Assessment   Recurrent sigmoid diverticulitis with multiple episodes   Plan   I agree with Dr. Marlane Hatcher assessment. I've offered sigmoid colectomy. I've offered an open approach to fully palpate and evaluate her colon to remove the entire diseased segment. We discussed this in detail. We discussed the risks, benefits, and  discussed the procedure in detail. She will need to undergo a bowel prep. Prior to scheduling, we need to have documentation from Dr. Katrinka Blazing regarding her cardiac clearance. Once we obtain that, we will plan to schedule. I answered her questions.   Update: cardiac clearance received.  No other changes.  [name], MD, MPH, FACS Pager: [phone]

## 2012-05-02 NOTE — Progress Notes (Signed)
reportmgiven to [employer] as caregiver

## 2012-05-02 NOTE — Progress Notes (Signed)
Abg,s drawn cxr done as ordered

## 2012-05-02 NOTE — Progress Notes (Signed)
On arrival to [facility] pt becoming cyanotic non unresposive o2 sats dropping to 43 immediateley oral airway inserted and respirations assited with ambu bagging..sats remain low and dropping dr Denton Ar paged dr Noreene Larsson and dr hoderiene immediately responed pt reintubated by dr hoderiene w/o diffucult sats up to 95 pt placed on ventilator with settings of Tv=500 fio2=100 imv=14 and peep=5

## 2012-05-02 NOTE — Consult Note (Signed)
PULMONARY  / CRITICAL CARE MEDICINE  Name: Elizabeth Nicholson MRN: [id number] DOB: February 28, 1941    LOS: 0  REFERRING MD :  Gabrielle Dare. Janee Morn, MD  CHIEF COMPLAINT:  Pulmonary edema  BRIEF PATIENT DESCRIPTION: 72yo F s/p sigmoidectomy 2/2 diverticulosis, extubated post op, and requiring reintubation shortly thereafter 2/2 pulmonary edema.  LINES / TUBES: ETT 1/27 >> PIV 1/27 >>  CULTURES: None  ANTIBIOTICS: Cefoxitin preop 1/27  SIGNIFICANT EVENTS:  1/27 s/p sigmoidectomy 2/2 diverticulosis, extubated an re-intubated 2/2 pulmonary edema  LEVEL OF CARE:  SDU PRIMARY SERVICE:  General Surgery CONSULTANTS:  PCCM CODE STATUS:  DIET:  NPO DVT Px:  SCDs GI Px:  Protonix  HISTORY OF PRESENT ILLNESS:   72 yo F with PMH DM, HTN, mitral valve stenosis, and diverticulosis is POD 0 s/p sigmoidectomy 2/2 diverticulosis, who was extubated post op; in the PACU she began to desaturate and was ultimately re-intubated. CXR revealed pulmonary edema, and she was given Lasix 10mg  IV x1.    PAST MEDICAL HISTORY :  Past Medical History  Diagnosis Date  . Hypertension   . Hypothyroidism   . Depression   . Diabetes mellitus   . Heart murmur   . Shortness of breath   . CHF (congestive heart failure)   . Hyperlipidemia    Past Surgical History  Procedure Date  . Cholecystectomy 1998  . Breast surgery [id number]  . Abdominal hysterectomy 12/25/2003   Prior to Admission medications   Medication Sig Start Date End Date Taking? Authorizing Provider  furosemide (LASIX) 40 MG tablet Take 1 tablet (40 mg total) by mouth daily. 01/10/12  Yes Othella Boyer, MD  insulin glargine (LANTUS) 100 UNIT/ML injection Inject 40 Units into the skin at bedtime.    Yes Historical Provider, MD  insulin lispro (HUMALOG) 100 UNIT/ML injection Inject 12-22 Units into the skin 3 (three) times daily before meals. Use 12 units with breakfast and Lunch, and 18 units with dinner. Add 1 additional unit for every 30 points  over 100.   Yes Historical Provider, MD  irbesartan (AVAPRO) 150 MG tablet Take 150 mg by mouth daily.  02/13/12  Yes Historical Provider, MD  [facility] Lanius CAPS Take 1 capsule by mouth daily.   Yes Historical Provider, MD  levothyroxine (SYNTHROID, LEVOTHROID) 112 MCG tablet Take 112 mcg by mouth daily.   Yes Historical Provider, MD  metoprolol succinate (TOPROL-XL) 50 MG 24 hr tablet Take 1 tablet (50 mg total) by mouth daily. Take with or immediately following a meal. 01/10/12  Yes Othella Boyer, MD  OVER THE COUNTER MEDICATION Take 2 tablets by mouth daily. Mega antioxidant   Yes Historical Provider, MD  potassium chloride SA (K-DUR,KLOR-CON) 20 MEQ tablet Take 1 tablet (20 mEq total) by mouth daily. 01/10/12  Yes Othella Boyer, MD  Probiotic Product (PROBIOTIC DAILY PO) Take 1 tablet by mouth daily.   Yes Historical Provider, MD  rosuvastatin (CRESTOR) 5 MG tablet Take 5 mg by mouth every Monday, Wednesday, and Friday.   Yes Historical Provider, MD  ONE TOUCH ULTRA TEST test strip  01/21/12   Historical Provider, MD  Dola Argyle LANCETS MISC  01/21/12   Historical Provider, MD   Allergies  Allergen Reactions  . Benazepril Cough  . Metformin And Related Diarrhea  . Other Other (See Comments)    Steri Strips. REACTION:  Blisters.  . Sulfa Antibiotics Other (See Comments)    Topical sulfa only.   REACTION:  unknown  .  Zoloft (Sertraline Hcl) Other (See Comments)    REACTION:  Negative mindset    FAMILY HISTORY:  Family History  Problem Relation Age of Onset  . Coronary artery disease Mother   . Diabetes type II Father   . Hypothyroidism Father   . Parkinsonism Father   . Coronary artery disease Brother   . Diabetes type II Brother   . Cancer Maternal Grandmother     breast   SOCIAL HISTORY:  reports that she has never smoked. She has never used smokeless tobacco. She reports that she does not drink alcohol or use illicit drugs.  REVIEW OF SYSTEMS:   Unable to assess,  patient intubated and sedated.  INTERVAL HISTORY:  1/27 s/p sigmoidectomy 2/2 diverticulosis, extubated, re-intubated 2/2 pulmonary edema, tx to SDU.  VITAL SIGNS: Temp:  [97.4 F (36.3 C)-98.6 F (37 C)] 97.7 F (36.5 C) (01/27 1615) Pulse Rate:  [70-81] 74  (01/27 1500) Resp:  [13-23] 23  (01/27 1500) BP: (112-158)/(43-80) 131/57 mmHg (01/27 1615) SpO2:  [95 %-99 %] 99 % (01/27 1500) Arterial Line BP: (112-199)/(56-96) 112/70 mmHg (01/27 1615) FiO2 (%):  [100 %] 100 % (01/27 1515) Weight:  [199 lb 11.2 oz (90.583 kg)] 199 lb 11.2 oz (90.583 kg) (01/27 1300) HEMODYNAMICS:   VENTILATOR SETTINGS: Vent Mode:  [-] PRVC FiO2 (%):  [100 %] 100 % Set Rate:  [14 bmp] 14 bmp Vt Set:  [500 mL] 500 mL PEEP:  [5 cmH20] 5 cmH20 Plateau Pressure:  [18 cmH20] 18 cmH20 INTAKE / OUTPUT: Intake/Output      01/26 0701 - 01/27 0700 01/27 0701 - 01/28 0700   I.V. (mL/kg)  2000 (22.1)   Total Intake(mL/kg)  2000 (22.1)   Urine (mL/kg/hr)  700 (0.8)   Blood  100   Total Output  800   Net  +1200         PHYSICAL EXAMINATION: General: Intubated and sedated Neuro:  Sedated, nonfocal HEENT:  NCAT, ETT in place Cardiovascular:  RRR Lungs:  Rales throughout  Abdomen:  Soft, non-distended, surgical bandage with minimal sanguinous output Musculoskeletal:  Moves all 4 ext Skin: No rashes or breakdown noted  LABS: Cbc  Lab 04/26/12 1049  WBC 7.8  HGB 13.7  HCT 39.3  PLT 206   Chemistry  Lab 04/26/12 1049  NA 139  K 4.1  CL 100  CO2 26  BUN 23  CREATININE 1.11*  CALCIUM 9.5  MG --  PHOS --  GLUCOSE 151*   ABG  Lab 05/02/12 1430  PHART 7.233*  PCO2ART 50.6*  PO2ART 95.2  HCO3 20.6  TCO2 50.6   CBG trend  Lab 05/02/12 1401 05/02/12 0930  GLUCAP 193* 151*   IMAGING: Dg Chest Port 1 View  05/02/2012  *RADIOLOGY REPORT*  Clinical Data: Respiratory failure.  Respiratory distress. Endotracheal tube placement.  PORTABLE CHEST - 1 VIEW  Comparison: 04/26/2012.  Findings:  Endotracheal tube is present, new from the prior exam. The tip of the endotracheal tube is 41 mm from the carina.  There is diffuse bilateral left greater than right airspace disease, probably representing pulmonary edema.  This has a basilar predominance.  Basilar atelectasis is also present. Cardiopericardial silhouette appears unchanged allowing for projection.  Calcified lymph nodes in the left hilum.  IMPRESSION:  1.  Endotracheal tube 41 mm from the carina. 2.  Diffuse bilateral perihilar and basilar predominant airspace disease is most compatible with pulmonary edema, probably due to volume overload.  Aspiration pneumonitis considered less likely.  Original Report Authenticated By: Andreas Newport, M.D.    ECG: Pending  DIAGNOSES: Active Problems:  History of diverticulitis of colon  Pulmonary edema, acute   ASSESSMENT / PLAN:  PULMONARY  ASSESSMENT: Pulmonary edema s/p abdominal surgery  PLAN:   Diuresing with Lasix AM CXR  CARDIOVASCULAR  ASSESSMENT:  H/o HTN, required labetalol post-op H/o  Mitral stenosis H/o diastolic CHF, no ECHO on record here PLAN:  Diuresing, as noted above  RENAL  ASSESSMENT:  Cr mildly elevated Good uop with Lasix  PLAN:   Continue Lasix AM BMP Potassium replacement AM labs  GASTROINTESTINAL  ASSESSMENT:  H/o diverticulosis, s/p partial colectomy  PLAN:   NPO, per primary team  HEMATOLOGIC  ASSESSMENT:  PLAN:  F/u CBC  INFECTIOUS  ASSESSMENT:  Afebrile PLAN:   None  ENDOCRINE  ASSESSMENT:  H/o DM, cbgs elevated post-op H/y hypothyroidism, on synthroid at home   PLAN:   Monitor CBGs Restart home meds once able per primary team  NEUROLOGIC  ASSESSMENT:  Sedated on Propofol  PLAN:   Wean Propofol as tolerated  CLINICAL SUMMARY: 72 yo F s/p sigmoid resection 2/2 diverticulosis who failed extubation, requiring reintubation 2/2 pulmonary edema. Diuresing with Lasix, with good uop. Will continue to monitor  and hopefully extubate in the morning.  Genelle Gather, MD Internal Medicine Resident, PGY I [facility] Internal Medicine Program Pager: [phone] 05/02/2012 5:07 PM   I have personally obtained a history, examined the patient, evaluated laboratory and imaging results, formulated the assessment and plan and placed orders. CRITICAL CARE: The patient is critically ill with multiple organ systems failure and requires high complexity decision making for assessment and support, frequent evaluation and titration of therapies, application of advanced monitoring technologies and extensive interpretation of multiple databases. Critical Care Time devoted to patient care services described in this note is 45 minutes.   Alyson Reedy, M.D. Pulmonary and Critical Care Medicine [facility] Pager: [phone]  05/02/2012, 4:46 PM

## 2012-05-02 NOTE — Anesthesia Postprocedure Evaluation (Signed)
  Anesthesia Post-op Note  Patient: Elizabeth Nicholson  Procedure(s) Performed: Procedure(s) (LRB) with comments: COLON RESECTION SIGMOID (N/A)  Patient Location: PACU  Anesthesia Type:General  Level of Consciousness: sedated  Airway and Oxygen Therapy: Patient Spontanous Breathing and Patient re-intubated  Post-op Pain: mild  Post-op Assessment: Post-op Vital signs reviewed, Patient's Cardiovascular Status Stable and RESPIRATORY FUNCTION UNSTABLE  Post-op Vital Signs: Reviewed and stable  Complications: Patient re-intubated and respiratory complications.  CXR revealed bilateral pulmonary congestion.

## 2012-05-02 NOTE — Transfer of Care (Signed)
Immediate Anesthesia Transfer of Care Note  Patient: Elizabeth Nicholson  Procedure(s) Performed: Procedure(s) (LRB) with comments: COLON RESECTION SIGMOID (N/A)  Patient Location: PACU  Anesthesia Type:General  Level of Consciousness: pateint uncooperative, confused and responds to stimulation  Airway & Oxygen Therapy: Patient re-intubated and Patient placed on Ventilator (see vital sign flow sheet for setting)  Post-op Assessment: Report given to PACU RN and Post -op Vital signs reviewed and stable  Post vital signs: Reviewed and stable  Complications: Patient re-intubated

## 2012-05-02 NOTE — Op Note (Signed)
05/02/2012  1:22 PM  PATIENT:  Elizabeth Nicholson  72 y.o. female  PRE-OPERATIVE DIAGNOSIS:  SIGMOID DIVERTICULITIS  POST-OPERATIVE DIAGNOSIS:  SIGMOID DIVERTICULITIS  PROCEDURE:  Procedure(s): COLON RESECTION SIGMOID  SURGEON:  [name], MD  PHYSICIAN ASSISTANT:   ASSISTANTS: Darnell Level, MD   ANESTHESIA:   general  EBL:  Total I/O In: 1600 [I.V.:1600] Out: 200 [Urine:100; Blood:100]  BLOOD ADMINISTERED:none  DRAINS: none   SPECIMEN:  Excision  DISPOSITION OF SPECIMEN:  PATHOLOGY  COUNTS:  YES  DICTATION: Reubin Milan Dictation  Patient presents for sigmoid colon resection. She was identified in the preop holding area. She received intravenous antibiotics. Informed consent was obtained. She was brought to the operating room and general endotracheal anesthesia was a Optician, dispensing by the anesthesia staff. She was placed in lithotomy position after insertion of Foley catheter by nursing. Abdomen and perineum were prepped and draped in sterile fashion. We did time out procedure. Lower midline incision was made. This was dissected down to the anterior fascia. Incision was then continued up above the umbilicus as the patient had a small umbilical hernia. Fascia was divided along the midline. No perinephric every was entered under direct vision. Some fatty adhesions of omentum were taken down from her umbilical hernia. Fascia was opened the length of the incision. Exploration revealed no other significant adhesions. There was a segment of diseased sigmoid easily palpable. Left colon was mobilized from lateral peritoneal attachments along the white line. Sigmoid was densely adherent to the pelvic sidewall and anterior pelvis. This was gradually and carefully dissected. There was a lot of woody inflammation from chronic disease. This gradually freed up and there was a normal short segment of distal sigmoid just above the rectum. At this point the sigmoid was divided proximal to the diseased segment.  We used a GIA-75 stapler. Her descending colon was without significant diverticulosis. Next the mesentery of the sigmoid was taken down using LigaSure. We stayed along the bowel wall and avoided the area of the ureter. A couple vessels were also suture ligated with 2-0 silk. We continued dividing the mesentery down to below the diseased segment. Next a clean and disease free air rectosigmoid was cleaned off. This was divided with a contour stapler. Hemostasis was ensured. The specimen was marked for pathology and sent. Next we further mobilized the left colon up towards the splenic flexure. There was plenty of length for treatments down into the pelvis for an EEA anastomosis. Extra draping was placed per colon Protocol.Next we weaned off the distal end of the sigmoid colon And clamped approximately the spring bowel clamp. Next the staple line was removed. A pursestring of 2-0 Prolene was placed. The end was viable. 25 sizer just fit and was quite snug. We want to try a 29 size and this was clearly today. Decision was made for the 25 EEA. Ampulla was placed in the sigmoid and the pursestring Prolene was tied down securely. Next the assistant went below and first finger dilated the anal area and the EEA stapler was placed from below. He was directed to up and came nicely to an anterior portion of the rectosigmoid. It was a couple centimeters distal to the staple line however most optimal place to bring out the spike seemed anterior there. This was done under direct vision and the anvil was connected and snapped in place. The EEA was fired in standard fashion. Cannot easily. The colon was then clamped gently proximally with fingers instead of the spring bowel clamp. Best boy  was blown from below by the assistant and colon distended nicely. The anastomosis appeared viable. There was no leak of any air bubbles whatsoever. Anastomosis was also visualized through the scope by the assistant.. No complicating features and no  bleeding was seen. Colon was decompressed.  Patel changed gowns and gloves and switched instruments. Abdomen was copiously irrigated. Hemostasis was ensured. Irrigation fluid returned clear. Bowel was returned to anatomic position. Anastomosis was reinspected and appeared pink and viable. Fascia was then closed with 2 lengths of #1 PDS from each end of the fascia to the middle. Subcutaneous tissues were irrigated and closed with staples. Sponge, needle, and instrument counts were all correct. Patient tolerated procedure well without apparent complication and was taken recovery in stable condition.  PATIENT DISPOSITION:  PACU - hemodynamically stable.   Delay start of Pharmacological VTE agent (>24hrs) due to surgical blood loss or risk of bleeding:  no  [name], MD, MPH, FACS Pager: [phone]  1/27/20141:22 PM

## 2012-05-02 NOTE — Preoperative (Signed)
Beta Blockers   Reason not to administer Beta Blockers:Not Applicable 

## 2012-05-02 NOTE — Interval H&P Note (Signed)
History and Physical Interval Note:  05/02/2012 10:25 AM  Elizabeth Nicholson  has presented today for surgery, with the diagnosis of diverticulitis  The various methods of treatment have been discussed with the patient and family. After consideration of risks, benefits and other options for treatment, the patient has consented to  Procedure(s) (LRB) with comments: COLON RESECTION SIGMOID (N/A) - sigmoid colectomy as a surgical intervention .  The patient's history has been reviewed, patient re-examined, no change in status, stable for surgery.  I have reviewed the patient's chart and labs.  Questions were answered to the patient's satisfaction.     [name] E

## 2012-05-02 NOTE — Progress Notes (Signed)
Pt reintubated post surgery and placed on the vent at this time. Vent settings per RT. No complications noted. RT will monitor.

## 2012-05-02 NOTE — Progress Notes (Signed)
Albuterol neb rx done as ordered by resp therapy

## 2012-05-02 NOTE — Progress Notes (Signed)
At end of surgery, pt was extubated and brought to PACU.  I was called emergently to PACU due to desaturations.  Upon arrival, pt was being bag masked and Sat 84%.  Pt was unresponsive and decision was made for reintubation.  Reintubated with glide scope 7.5 mm ETT placed without problem.  100mg  sux and 100mg  propofol given for induction.  Sat improved quickly to 99%.  +B/L breath sounds.  Pt was subsequently sedated with propofol.  CXR done that revealed bilateral congestion c/w fluid overload.  Lasix 10mg  given.

## 2012-05-02 NOTE — Anesthesia Preprocedure Evaluation (Signed)
Anesthesia Evaluation  Patient identified by MRN, date of birth, ID band Patient awake    Reviewed: Allergy & Precautions, H&P , NPO status , Patient's Chart, lab work & pertinent test results, reviewed documented beta blocker date and time   Airway Mallampati: II TM Distance: >3 FB Neck ROM: full    Dental   Pulmonary shortness of breath and with exertion,  breath sounds clear to auscultation        Cardiovascular hypertension, On Medications and On Home Beta Blockers +CHF + Valvular Problems/Murmurs Rhythm:regular     Neuro/Psych PSYCHIATRIC DISORDERS negative neurological ROS     GI/Hepatic negative GI ROS, Neg liver ROS,   Endo/Other  diabetes, Insulin DependentHypothyroidism   Renal/GU negative Renal ROS  negative genitourinary   Musculoskeletal   Abdominal   Peds  Hematology negative hematology ROS (+)   Anesthesia Other Findings See surgeon's H&P   Reproductive/Obstetrics negative OB ROS                           Anesthesia Physical Anesthesia Plan  ASA: III  Anesthesia Plan: General   Post-op Pain Management:    Induction: Intravenous  Airway Management Planned: Oral ETT  Additional Equipment: Arterial line  Intra-op Plan:   Post-operative Plan: Extubation in OR  Informed Consent: I have reviewed the patients History and Physical, chart, labs and discussed the procedure including the risks, benefits and alternatives for the proposed anesthesia with the patient or authorized representative who has indicated his/her understanding and acceptance.   Dental Advisory Given  Plan Discussed with: CRNA and Surgeon  Anesthesia Plan Comments:         Anesthesia Quick Evaluation

## 2012-05-03 ENCOUNTER — Inpatient Hospital Stay (HOSPITAL_COMMUNITY): Payer: Medicare Other

## 2012-05-03 ENCOUNTER — Encounter (HOSPITAL_COMMUNITY): Payer: Self-pay | Admitting: General Surgery

## 2012-05-03 DIAGNOSIS — J810 Acute pulmonary edema: Secondary | ICD-10-CM

## 2012-05-03 DIAGNOSIS — E119 Type 2 diabetes mellitus without complications: Secondary | ICD-10-CM

## 2012-05-03 LAB — BASIC METABOLIC PANEL
CO2: 24 mEq/L (ref 19–32)
Calcium: 8.2 mg/dL — ABNORMAL LOW (ref 8.4–10.5)
Creatinine, Ser: 1.33 mg/dL — ABNORMAL HIGH (ref 0.50–1.10)
GFR calc non Af Amer: 39 mL/min — ABNORMAL LOW (ref >90)
Glucose, Bld: 168 mg/dL — ABNORMAL HIGH (ref 70–99)

## 2012-05-03 LAB — CBC
Hemoglobin: 12 g/dL (ref 12.0–15.0)
MCH: 31.1 pg (ref 26.0–34.0)
MCHC: 34.6 g/dL (ref 30.0–36.0)
MCV: 89.9 fL (ref 78.0–100.0)
RBC: 3.86 MIL/uL — ABNORMAL LOW (ref 3.87–5.11)

## 2012-05-03 LAB — GLUCOSE, CAPILLARY
Glucose-Capillary: 149 mg/dL — ABNORMAL HIGH (ref 70–99)
Glucose-Capillary: 137 mg/dL — ABNORMAL HIGH (ref 70–99)
Glucose-Capillary: 162 mg/dL — ABNORMAL HIGH (ref 70–99)

## 2012-05-03 LAB — PHOSPHORUS: Phosphorus: 4.2 mg/dL (ref 2.3–4.6)

## 2012-05-03 MED ORDER — FENTANYL BOLUS VIA INFUSION
25.0000 ug | INTRAVENOUS | Status: DC | PRN
  Filled 2012-05-03: qty 100

## 2012-05-03 MED ORDER — HYDROMORPHONE 0.3 MG/ML IV SOLN
INTRAVENOUS | Status: DC
  Administered 2012-05-03: 14:00:00 via INTRAVENOUS
  Administered 2012-05-03: 0.6 mg via INTRAVENOUS
  Administered 2012-05-04 (×3): 0.2 mg via INTRAVENOUS

## 2012-05-03 MED ORDER — SODIUM CHLORIDE 0.9 % IJ SOLN: 9.0000 mL | INTRAMUSCULAR | Status: DC | PRN

## 2012-05-03 MED ORDER — FUROSEMIDE 10 MG/ML IJ SOLN
20.0000 mg | Freq: Every day | INTRAMUSCULAR | Status: DC
  Administered 2012-05-03 – 2012-05-05 (×3): 20 mg via INTRAVENOUS
  Filled 2012-05-03 (×4): qty 2

## 2012-05-03 MED ORDER — HYDROMORPHONE 0.3 MG/ML IV SOLN
INTRAVENOUS | Status: AC
  Filled 2012-05-03: qty 25

## 2012-05-03 MED ORDER — DIPHENHYDRAMINE HCL 50 MG/ML IJ SOLN: 12.5000 mg | Freq: Four times a day (QID) | INTRAMUSCULAR | Status: DC | PRN

## 2012-05-03 MED ORDER — PNEUMOCOCCAL VAC POLYVALENT 25 MCG/0.5ML IJ INJ: 0.5000 mL | INJECTION | INTRAMUSCULAR | Status: DC | PRN

## 2012-05-03 MED ORDER — DIPHENHYDRAMINE HCL 12.5 MG/5ML PO ELIX
12.5000 mg | ORAL_SOLUTION | Freq: Four times a day (QID) | ORAL | Status: DC | PRN
  Filled 2012-05-03: qty 5

## 2012-05-03 MED ORDER — METOPROLOL TARTRATE 1 MG/ML IV SOLN
5.0000 mg | Freq: Four times a day (QID) | INTRAVENOUS | Status: DC
  Administered 2012-05-03 – 2012-05-06 (×10): 5 mg via INTRAVENOUS
  Filled 2012-05-03 (×15): qty 5

## 2012-05-03 MED ORDER — LEVOTHYROXINE SODIUM 100 MCG IV SOLR
50.0000 ug | Freq: Every day | INTRAVENOUS | Status: DC
  Administered 2012-05-03 – 2012-05-05 (×3): 50 ug via INTRAVENOUS
  Filled 2012-05-03 (×6): qty 2.5

## 2012-05-03 MED ORDER — CHLORHEXIDINE GLUCONATE 0.12 % MT SOLN
15.0000 mL | Freq: Two times a day (BID) | OROMUCOSAL | Status: DC
  Administered 2012-05-03 – 2012-05-06 (×6): 15 mL via OROMUCOSAL
  Filled 2012-05-03 (×7): qty 15

## 2012-05-03 MED ORDER — ONDANSETRON HCL 4 MG/2ML IJ SOLN: 4.0000 mg | Freq: Four times a day (QID) | INTRAMUSCULAR | Status: DC | PRN

## 2012-05-03 MED ORDER — NALOXONE HCL 0.4 MG/ML IJ SOLN: 0.4000 mg | INTRAMUSCULAR | Status: DC | PRN

## 2012-05-03 NOTE — Progress Notes (Signed)
Utilization Review Completed.Elizabeth Nicholson T12/11/2012  

## 2012-05-03 NOTE — Progress Notes (Signed)
Patient ID: Elizabeth Nicholson, female   DOB: 22-Jan-1941, 72 y.o.   MRN: [id number] Doing well except BP labile with lopressor.  Gently increase IVF (40cc/HR). Plan D/W patient  [name], MD, MPH, FACS Pager: [phone]  05/03/2012 4:31 PM

## 2012-05-03 NOTE — Progress Notes (Signed)
PULMONARY  / CRITICAL CARE MEDICINE  Name: Elizabeth Nicholson MRN: [id number] DOB: 08-Dec-1940    LOS: 1  REFERRING MD :  Gabrielle Dare. Janee Morn, MD  CHIEF COMPLAINT:  Pulmonary edema  BRIEF PATIENT DESCRIPTION:  72yo F s/p sigmoidectomy 2/2 diverticulosis, extubated post op, and requiring reintubation shortly thereafter likely from negative pressure pulmonary edema.  Significant PMHx of HTN, Hypothyroidism, DM, Depression, Hyperlipidemia, Mitral stenosis  LINES / TUBES: ETT 1/27 >> 1/28  CULTURES: None  ANTIBIOTICS: Cefoxitin preop 1/27 >> 1/27  SIGNIFICANT EVENTS:  1/27 s/p sigmoidectomy 2/2 diverticulosis, extubated an re-intubated 2/2 pulmonary edema    INTERVAL HISTORY:  Tolerating pressure support.  Denies chest pain.  VITAL SIGNS: Temp:  [97.4 F (36.3 C)-99.5 F (37.5 C)] 98.9 F (37.2 C) (01/28 0746) Pulse Rate:  [63-91] 91  (01/28 0828) Resp:  [13-27] 20  (01/28 0828) BP: (101-158)/(40-80) 106/43 mmHg (01/28 0700) SpO2:  [95 %-100 %] 100 % (01/28 0828) Arterial Line BP: (112-199)/(56-96) 112/70 mmHg (01/27 1615) FiO2 (%):  [40 %-100 %] 40 % (01/28 0828) Weight:  [199 lb 11.2 oz (90.583 kg)] 199 lb 11.2 oz (90.583 kg) (01/27 1300) HEMODYNAMICS:   VENTILATOR SETTINGS: Vent Mode:  [-] PRVC FiO2 (%):  [40 %-100 %] 40 % Set Rate:  [14 bmp] 14 bmp Vt Set:  [500 mL] 500 mL PEEP:  [5 cmH20] 5 cmH20 Plateau Pressure:  [10 cmH20-18 cmH20] 18 cmH20 INTAKE / OUTPUT: Intake/Output      01/27 0701 - 01/28 0700 01/28 0701 - 01/29 0700   I.V. (mL/kg) 2393.2 (26.4)    IV Piggyback 408    Total Intake(mL/kg) 2801.2 (30.9)    Urine (mL/kg/hr) 2625 (1.2)    Blood 100    Total Output 2725    Net +76.2          PHYSICAL EXAMINATION: General: No distress Neuro:  Follows commands HEENT: ETT in place Cardiovascular: s1s2 regular Lungs: no wheeze/rales Abdomen: wound site clean Musculoskeletal: no edema Skin: No rashes  LABS: Cbc  Lab 05/03/12 0430 04/26/12 1049    WBC 12.6* --  HGB 12.0 13.7  HCT 34.7* 39.3  PLT 176 206   Chemistry  Lab 05/03/12 0430 04/26/12 1049  NA 136 139  K 3.8 4.1  CL 100 100  CO2 24 26  BUN 19 23  CREATININE 1.33* 1.11*  CALCIUM 8.2* 9.5  MG 1.6 --  PHOS 4.2 --  GLUCOSE 168* 151*   ABG  Lab 05/02/12 1807 05/02/12 1430  PHART 7.307* 7.233*  PCO2ART 39.6 50.6*  PO2ART 244.0* 95.2  HCO3 19.2* 20.6  TCO2 20.4 50.6   CBG trend  Lab 05/03/12 0337 05/02/12 2344 05/02/12 1957 05/02/12 1752 05/02/12 1401  GLUCAP 149* 165* 195* 251* 193*   IMAGING: Dg Chest Port 1 View  05/03/2012  *RADIOLOGY REPORT*  Clinical Data: Effusions and atelectasis.  PORTABLE CHEST - 1 VIEW  Comparison: 05/02/2012  Findings: Endotracheal tube is in good position.  NG tube has been inserted and the tip is below the diaphragm.  The pulmonary edema seen on the prior study has resolved.  Minimal residual effusion and atelectasis at the left lung base.  Right lung is clear.  IMPRESSION: Resolution of pulmonary edema.  Minimal atelectasis and effusion at the left base.   Original Report Authenticated By: Francene Boyers, M.D.    Dg Chest Port 1 View  05/02/2012  *RADIOLOGY REPORT*  Clinical Data: Respiratory failure.  Respiratory distress. Endotracheal tube placement.  PORTABLE CHEST -  1 VIEW  Comparison: 04/26/2012.  Findings: Endotracheal tube is present, new from the prior exam. The tip of the endotracheal tube is 41 mm from the carina.  There is diffuse bilateral left greater than right airspace disease, probably representing pulmonary edema.  This has a basilar predominance.  Basilar atelectasis is also present. Cardiopericardial silhouette appears unchanged allowing for projection.  Calcified lymph nodes in the left hilum.  IMPRESSION:  1.  Endotracheal tube 41 mm from the carina. 2.  Diffuse bilateral perihilar and basilar predominant airspace disease is most compatible with pulmonary edema, probably due to volume overload.  Aspiration pneumonitis  considered less likely.   Original Report Authenticated By: Andreas Newport, M.D.     ASSESSMENT / PLAN:  Acute respiratory failure likely 2nd to negative pressure pulmonary edema after extubation 1/27. Plan: Proceed with extubation 1/28 F/u CXR as needed Adjust oxygen to keep SpO2 > 92% Bronchial hygiene Goal even to negative fluid balance  Acute pulmonary edema. Cardiac enzymes negative.  Recent negative cardiac cath reported >> followed by Dr. Garnette Scheuermann. Hx of HTN, Mitral stenosis, Hyperlipidemia. Plan: Continue IV lasix, metoprolol for now Resume avapro, crestor when able to take oral meds  S/p sigmoid colectomy for recurrent diverticulitis. Plan: Post-op care, nutrition, pain control per CCS  Hx of DM type II. Plan: SSI  Hx of Hypothyroidism. Plan: Continue synthroid   CC time 35 minutes.  Coralyn Helling, MD [facility] Pulmonary/Critical Care 05/03/2012, 8:50 AM Pager:  [phone] After 3pm call: [phone]

## 2012-05-03 NOTE — Procedures (Signed)
Extubation Procedure Note  Patient Details:   Name: Elizabeth Nicholson DOB: 03/04/1941 MRN: [id number]   Airway Documentation:     Evaluation  O2 sats: stable throughout Complications: No apparent complications Patient did tolerate procedure well. Bilateral Breath Sounds:  (Coarse) Suctioning: Oral Yes Able to breath around deflated cuff, held head off bed on request, NIF-20 cmh20, FVC-1260 cc, no stridor noted, RN aware, RT will monitor. Joylene John 05/03/2012, 9:18 AM

## 2012-05-03 NOTE — Sedation Documentation (Signed)
Wasted 220 ml of Fentanyl drip 25mcg/ml in sink. Witnessed by [employer].

## 2012-05-03 NOTE — Progress Notes (Signed)
Inpatient Diabetes Program Recommendations  AACE/ADA: New Consensus Statement on Inpatient Glycemic Control (2013)  Target Ranges:  Prepandial:   less than 140 mg/dL      Peak postprandial:   less than 180 mg/dL (1-2 hours)      Critically ill patients:  140 - 180 mg/dL   Reason for Visit: Results for Elizabeth Nicholson, Elizabeth Nicholson (MRN [id number]) as of 05/03/2012 10:40  Ref. Range 05/02/2012 17:52 05/02/2012 19:57 05/02/2012 23:44 05/03/2012 03:37  Glucose-Capillary Latest Range: 70-99 mg/dL 478 (H) 295 (H) 621 (H) 149 (H)   Note patient has history of diabetes.  According to home medication reconciliation, she was taking Lantus 40 units q HS and Novolog 12-22 units with meals.  If CBG's trend greater than 180 mg/dL, consider adding 1/2 of home basal insulin.  Consider Lantus 20 units once daily.       Note: Please check A1C to determine home glycemic control.

## 2012-05-03 NOTE — Progress Notes (Signed)
1 Day Post-Op  Subjective: Awake on vent  Objective: Vital signs in last 24 hours: Temp:  [97.4 F (36.3 C)-99.5 F (37.5 C)] 99.5 F (37.5 C) (01/28 0400) Pulse Rate:  [63-81] 73  (01/28 0700) Resp:  [13-27] 19  (01/28 0700) BP: (101-158)/(40-80) 106/43 mmHg (01/28 0700) SpO2:  [95 %-100 %] 99 % (01/28 0700) Arterial Line BP: (112-199)/(56-96) 112/70 mmHg (01/27 1615) FiO2 (%):  [40 %-100 %] 40 % (01/28 0443) Weight:  [90.583 kg (199 lb 11.2 oz)] 90.583 kg (199 lb 11.2 oz) (01/27 1300)    Intake/Output from previous day: 01/27 0701 - 01/28 0700 In: 2801.2 [I.V.:2393.2; IV Piggyback:408] Out: 2725 [Urine:2625; Blood:100] Intake/Output this shift:    General appearance: no distress Resp: clear to auscultation bilaterally Cardio: regular rate and rhythm GI: soft, quiet, small drainage under dressing Extremities: no sig edema Neurologic: Mental status: Alert, oriented, thought content appropriate, follows commands X 4 ext  Lab Results:   Basename 05/03/12 0430  WBC 12.6*  HGB 12.0  HCT 34.7*  PLT 176   BMET  Basename 05/03/12 0430  NA 136  K 3.8  CL 100  CO2 24  GLUCOSE 168*  BUN 19  CREATININE 1.33*  CALCIUM 8.2*   PT/INR No results found for this basename: LABPROT:2,INR:2 in the last 72 hours ABG  Basename 05/02/12 1807 05/02/12 1430  PHART 7.307* 7.233*  HCO3 19.2* 20.6    Studies/Results: Dg Chest Port 1 View  05/02/2012  *RADIOLOGY REPORT*  Clinical Data: Respiratory failure.  Respiratory distress. Endotracheal tube placement.  PORTABLE CHEST - 1 VIEW  Comparison: 04/26/2012.  Findings: Endotracheal tube is present, new from the prior exam. The tip of the endotracheal tube is 41 mm from the carina.  There is diffuse bilateral left greater than right airspace disease, probably representing pulmonary edema.  This has a basilar predominance.  Basilar atelectasis is also present. Cardiopericardial silhouette appears unchanged allowing for projection.   Calcified lymph nodes in the left hilum.  IMPRESSION:  1.  Endotracheal tube 41 mm from the carina. 2.  Diffuse bilateral perihilar and basilar predominant airspace disease is most compatible with pulmonary edema, probably due to volume overload.  Aspiration pneumonitis considered less likely.   Original Report Authenticated By: Andreas Newport, M.D.     Anti-infectives: Anti-infectives     Start     Dose/Rate Route Frequency Ordered Stop   05/02/12 0600   cefOXitin (MEFOXIN) 2 g in dextrose 5 % 50 mL IVPB        2 g 100 mL/hr over 30 Minutes Intravenous On call to O.R. 05/01/12 1336 05/02/12 1120          Assessment/Plan: s/p Procedure(s) (LRB) with comments: COLON RESECTION SIGMOID (N/A) Continue foley due to strict I&O S/P sigmoid colectomy for recurrent diverticulitis POD#1 VDRF due to pulmonary edema - appreciate CCM assist, CXR much improved, likely extubate this AM FEN - lytes OK Expected post-op ileus - Entereg, NGT for now on vent VTE - SQH IDDM - SSI  LOS: 1 day    Donna Snooks E 05/03/2012

## 2012-05-03 NOTE — Progress Notes (Signed)
Patient ID: Elizabeth Nicholson, female   DOB: Oct 29, 1940, 72 y.o.   MRN: [id number] Extubated and doing well. Will resume PCA and allow ice chips.  I spoke to her daughter on the phone and to her granddaughter in the unit.  I explained the plan of care [name], MD, MPH, FACS Pager: [phone] .

## 2012-05-04 LAB — CBC
HCT: 32 % — ABNORMAL LOW (ref 36.0–46.0)
MCH: 30.7 pg (ref 26.0–34.0)
MCV: 92.8 fL (ref 78.0–100.0)
Platelets: 137 10*3/uL — ABNORMAL LOW (ref 150–400)
RBC: 3.45 MIL/uL — ABNORMAL LOW (ref 3.87–5.11)
RDW: 13.7 % (ref 11.5–15.5)
WBC: 7.7 10*3/uL (ref 4.0–10.5)

## 2012-05-04 LAB — GLUCOSE, CAPILLARY
Glucose-Capillary: 151 mg/dL — ABNORMAL HIGH (ref 70–99)
Glucose-Capillary: 163 mg/dL — ABNORMAL HIGH (ref 70–99)

## 2012-05-04 LAB — BASIC METABOLIC PANEL
BUN: 21 mg/dL (ref 6–23)
CO2: 28 mEq/L (ref 19–32)
Calcium: 8.2 mg/dL — ABNORMAL LOW (ref 8.4–10.5)
Chloride: 104 mEq/L (ref 96–112)
Creatinine, Ser: 1.25 mg/dL — ABNORMAL HIGH (ref 0.50–1.10)

## 2012-05-04 NOTE — Evaluation (Signed)
Physical Therapy Evaluation Patient Details Name: Elizabeth Nicholson MRN: [id number] DOB: 11-28-1940 Today's Date: 05/04/2012 Time: 4098-1191 PT Time Calculation (min): 28 min  PT Assessment / Plan / Recommendation Clinical Impression  Patient is a 72 y.o. female s/p cholectomy. Patient demonstrates deficits in functional mobility as indicated below. Pt will benefit from continued skilled PT to address deficits and maximize independence for d./c.    PT Assessment  Patient needs continued PT services    Follow Up Recommendations  No PT follow up          Equipment Recommendations  None recommended by PT    Recommendations for Other Services     Frequency Min 3X/week    Precautions / Restrictions Precautions Precaution Comments: NG tube, abdominal wound Restrictions Weight Bearing Restrictions: No   Pertinent Vitals/Pain No pain reported prior to activity      Mobility  Bed Mobility Bed Mobility: Rolling Right;Right Sidelying to Sit Rolling Right: 5: Supervision;With rail Right Sidelying to Sit: 4: Min guard;HOB elevated;With rails Transfers Transfers: Sit to Stand;Stand to Sit Sit to Stand: 5: Supervision;With upper extremity assist;From bed Stand to Sit: 5: Supervision;With upper extremity assist;With armrests;To chair/3-in-1 Details for Transfer Assistance: VCs for safe hand placement Ambulation/Gait Ambulation/Gait Assistance: 5: Supervision Ambulation Distance (Feet): 160 Feet Assistive device: Rolling walker Ambulation/Gait Assistance Details: Steady with ambulation Gait Pattern: Step-through pattern Gait velocity: decreased Stairs: No           PT Diagnosis: Generalized weakness;Acute pain  PT Problem List: Decreased strength;Decreased activity tolerance;Decreased mobility;Pain PT Treatment Interventions: Gait training;DME instruction;Stair training;Functional mobility training;Therapeutic exercise;Therapeutic activities;Patient/family education   PT  Goals Acute Rehab PT Goals PT Goal Formulation: With patient Time For Goal Achievement: 05/11/12 Potential to Achieve Goals: Good Pt will go Supine/Side to Sit: with modified independence PT Goal: Supine/Side to Sit - Progress: Goal set today Pt will go Sit to Stand: with modified independence PT Goal: Sit to Stand - Progress: Goal set today Pt will go Stand to Sit: with modified independence PT Goal: Stand to Sit - Progress: Goal set today Pt will Ambulate: >150 feet;with modified independence PT Goal: Ambulate - Progress: Goal set today Pt will Go Up / Down Stairs: 3-5 stairs;with min assist PT Goal: Up/Down Stairs - Progress: Goal set today  Visit Information  Last PT Received On: 05/04/12 Assistance Needed: +1    Subjective Data      Prior Functioning  Home Living Lives With: Alone Available Help at Discharge: Family;Available 24 hours/day Type of Home: House Home Access: Stairs to enter [employer] of Steps: 4 Entrance Stairs-Rails: Right;Left Bathroom Shower/Tub: Walk-in shower;Tub/shower unit Bathroom Toilet: Handicapped height Home Adaptive Equipment: Grab bars in shower;Hand-held shower hose;Walker - rolling Prior Function Level of Independence: Independent Able to Take Stairs?: Yes Driving: Yes Vocation: Retired Musician: No difficulties Dominant Hand: Right    Cognition  Overall Cognitive Status: Appears within functional limits for tasks assessed/performed Arousal/Alertness: Awake/alert Orientation Level: Appears intact for tasks assessed Behavior During Session: [facility] for tasks performed    Extremity/Trunk Assessment Right Upper Extremity Assessment RUE ROM/Strength/Tone: Within functional levels Left Upper Extremity Assessment LUE ROM/Strength/Tone: Within functional levels Right Lower Extremity Assessment RLE ROM/Strength/Tone: Within functional levels Left Lower Extremity Assessment LLE ROM/Strength/Tone: Within  functional levels   Balance Balance Balance Assessed: Yes Dynamic Standing Balance Dynamic Standing - Balance Support: No upper extremity supported Dynamic Standing - Balance Activities:  (weight shifting, hygiene) Dynamic Standing - Comments: steady, supervision High Level Balance High Level  Balance Activites: Side stepping;Backward walking;Direction changes;Turns;Head turns High Level Balance Comments: steady with RW  End of Session PT - End of Session Equipment Utilized During Treatment: Gait belt Activity Tolerance: Patient tolerated treatment well Patient left: in chair;with call bell/phone within reach;with family/visitor present Nurse Communication: Mobility status  GP     Fabio Asa 05/04/2012, 3:57 PM Charlotte Crumb, PT DPT  [phone]

## 2012-05-04 NOTE — Evaluation (Signed)
Occupational Therapy Evaluation Patient Details Name: TYLENE QUASHIE MRN: [id number] DOB: January 11, 1941 Today's Date: 05/04/2012 Time: 4098-1191 OT Time Calculation (min): 28 min  OT Assessment / Plan / Recommendation Clinical Impression  This 72 yo female s/p colectomy presents to acute OT with problems below. Will benefit from acute OT without need for follow up.    OT Assessment  Patient needs continued OT Services    Follow Up Recommendations  No OT follow up    Barriers to Discharge None    Equipment Recommendations  None recommended by OT       Frequency  Min 2X/week    Precautions / Restrictions Precautions Precaution Comments: NG tube, abdominal wound Restrictions Weight Bearing Restrictions: No   Pertinent Vitals/Pain Stomach area when she moves or coughs (due to colectomy surgery)    ADL  Eating/Feeding: Simulated;Independent Where Assessed - Eating/Feeding: Chair Grooming: Performed;Supervision/safety Where Assessed - Grooming: Unsupported standing Upper Body Bathing: Simulated;Set up;Supervision/safety Where Assessed - Upper Body Bathing: Unsupported sit to stand Lower Body Bathing: Simulated;Set up;Supervision/safety Where Assessed - Lower Body Bathing: Unsupported sit to stand Upper Body Dressing: Simulated;Set up;Supervision/safety Where Assessed - Upper Body Dressing: Unsupported sit to stand Lower Body Dressing: Simulated;Set up;Supervision/safety Where Assessed - Lower Body Dressing: Unsupported sit to stand Toilet Transfer: Performed;Supervision/safety Toilet Transfer Method: Sit to stand Toilet Transfer Equipment: Comfort height toilet;Grab bars Toileting - Clothing Manipulation and Hygiene: Performed;Supervision/safety Where Assessed - Engineer, mining and Hygiene: Sit to stand from 3-in-1 or toilet Equipment Used: Rolling walker;Gait belt Transfers/Ambulation Related to ADLs: S sit to stand and stand to sit, Min guard A  ambulation ADL Comments: Can cross legs to get socks off, but not on at this time (reports that family will A her)    OT Diagnosis: Generalized weakness;Acute pain  OT Problem List: Decreased strength;Impaired balance (sitting and/or standing);Pain OT Treatment Interventions: Self-care/ADL training;Therapeutic activities;Patient/family education;DME and/or AE instruction;Balance training   OT Goals Acute Rehab OT Goals OT Goal Formulation: With patient Time For Goal Achievement: 05/11/12 Potential to Achieve Goals: Good ADL Goals Pt Will Perform Grooming: Independently;Unsupported;Standing at sink ADL Goal: Grooming - Progress: Goal set today Pt Will Transfer to Toilet: Independently;Ambulation;Comfort height toilet;Grab bars ADL Goal: Toilet Transfer - Progress: Goal set today Pt Will Perform Toileting - Clothing Manipulation: Independently;Standing ADL Goal: Toileting - Clothing Manipulation - Progress: Goal set today Pt Will Perform Toileting - Hygiene: Independently;Standing at 3-in-1/toilet ADL Goal: Toileting - Hygiene - Progress: Goal set today  Visit Information  Last OT Received On: 05/04/12 Assistance Needed: +1 PT/OT Co-Evaluation/Treatment: Yes    Subjective Data  Subjective: I took care of them, they can take care of me (her kids)   Prior Functioning     Home Living Lives With: Alone Available Help at Discharge: Family;Available 24 hours/day Type of Home: House Bathroom Shower/Tub: Walk-in shower;Tub/shower unit Bathroom Toilet: Handicapped height Home Adaptive Equipment: Grab bars in shower;Hand-held shower hose;Walker - rolling Prior Function Level of Independence: Independent Able to Take Stairs?: Yes Driving: Yes Vocation: Retired Musician: No difficulties Dominant Hand: Right            Cognition  Overall Cognitive Status: Appears within functional limits for tasks assessed/performed Arousal/Alertness:  Awake/alert Orientation Level: Appears intact for tasks assessed Behavior During Session: [facility] for tasks performed    Extremity/Trunk Assessment Right Upper Extremity Assessment RUE ROM/Strength/Tone: Within functional levels Left Upper Extremity Assessment LUE ROM/Strength/Tone: Within functional levels     Mobility Bed Mobility Bed Mobility: Rolling Right;Right  Sidelying to Sit Rolling Right: 5: Supervision;With rail Right Sidelying to Sit: 4: Min guard;HOB elevated;With rails (30 degrees) Transfers Transfers: Sit to Stand;Stand to Sit Sit to Stand: 5: Supervision;With upper extremity assist;From bed Stand to Sit: 5: Supervision;With upper extremity assist;With armrests;To chair/3-in-1 Details for Transfer Assistance: VCs for safe hand placement              End of Session OT - End of Session Equipment Utilized During Treatment: Gait belt (RW) Activity Tolerance: Patient tolerated treatment well Patient left: in chair;with call bell/phone within reach;with family/visitor present Nurse Communication:  (NT saw [place] walking in hall with her)       Evette Georges [phone] 05/04/2012, 3:28 PM

## 2012-05-04 NOTE — Progress Notes (Addendum)
2 Days Post-Op  Subjective: Up in chair, stable overnight, no flatus yet, rarely using PCA  Objective: Vital signs in last 24 hours: Temp:  [98.5 F (36.9 C)-98.9 F (37.2 C)] 98.5 F (36.9 C) (01/29 0400) Pulse Rate:  [63-91] 74  (01/29 0700) Resp:  [10-22] 21  (01/29 0700) BP: (86-125)/(33-55) 125/52 mmHg (01/29 0700) SpO2:  [90 %-100 %] 97 % (01/29 0700) FiO2 (%):  [40 %] 40 % (01/28 0828)    Intake/Output from previous day: 01/28 0701 - 01/29 0700 In: 782.5 [I.V.:690.5; NG/GT:90; IV Piggyback:2] Out: 1875 [Urine:1875] Intake/Output this shift:    General appearance: alert and cooperative Resp: clear to auscultation bilaterally Cardio: regular rate and rhythm GI: soft, NT, ND, +BS, dressing in place with no further drainage visable under it Neuro: A&O, F/C  Lab Results:   [facility] 05/04/12 0325 05/03/12 0430  WBC 7.7 12.6*  HGB 10.6* 12.0  HCT 32.0* 34.7*  PLT 137* 176   BMET  Basename 05/04/12 0325 05/03/12 0430  NA 141 136  K 3.8 3.8  CL 104 100  CO2 28 24  GLUCOSE 137* 168*  BUN 21 19  CREATININE 1.25* 1.33*  CALCIUM 8.2* 8.2*   PT/INR No results found for this basename: LABPROT:2,INR:2 in the last 72 hours ABG  Basename 05/02/12 1807 05/02/12 1430  PHART 7.307* 7.233*  HCO3 19.2* 20.6    Studies/Results: Dg Chest Port 1 View  05/03/2012  *RADIOLOGY REPORT*  Clinical Data: Effusions and atelectasis.  PORTABLE CHEST - 1 VIEW  Comparison: 05/02/2012  Findings: Endotracheal tube is in good position.  NG tube has been inserted and the tip is below the diaphragm.  The pulmonary edema seen on the prior study has resolved.  Minimal residual effusion and atelectasis at the left lung base.  Right lung is clear.  IMPRESSION: Resolution of pulmonary edema.  Minimal atelectasis and effusion at the left base.   Original Report Authenticated By: Francene Boyers, M.D.    Dg Chest Port 1 View  05/02/2012  *RADIOLOGY REPORT*  Clinical Data: Respiratory failure.   Respiratory distress. Endotracheal tube placement.  PORTABLE CHEST - 1 VIEW  Comparison: 04/26/2012.  Findings: Endotracheal tube is present, new from the prior exam. The tip of the endotracheal tube is 41 mm from the carina.  There is diffuse bilateral left greater than right airspace disease, probably representing pulmonary edema.  This has a basilar predominance.  Basilar atelectasis is also present. Cardiopericardial silhouette appears unchanged allowing for projection.  Calcified lymph nodes in the left hilum.  IMPRESSION:  1.  Endotracheal tube 41 mm from the carina. 2.  Diffuse bilateral perihilar and basilar predominant airspace disease is most compatible with pulmonary edema, probably due to volume overload.  Aspiration pneumonitis considered less likely.   Original Report Authenticated By: Andreas Newport, M.D.     Anti-infectives: Anti-infectives     Start     Dose/Rate Route Frequency Ordered Stop   05/02/12 0600   cefOXitin (MEFOXIN) 2 g in dextrose 5 % 50 mL IVPB        2 g 100 mL/hr over 30 Minutes Intravenous On call to O.R. 05/01/12 1336 05/02/12 1120          Assessment/Plan: s/p Procedure(s) (LRB) with comments: COLON RESECTION SIGMOID (N/A) d/c foley S/P sigmoid colectomy for recurrent diverticulitis POD#1 Resp - has done well since extubation yesterday AM FEN - lytes OK, CRT improved, continue IVF 40cc/h Expected post-op ileus - Entereg, NGT for now VTE - SQH  HTN IDDM - SSI, will add Lantus if CBG consistently over 180 Deconditioning - PT/OT To 6N  LOS: 2 days    Zamorah Ailes E 05/04/2012

## 2012-05-04 NOTE — Progress Notes (Signed)
PULMONARY  / CRITICAL CARE MEDICINE  Name: Elizabeth Nicholson MRN: [id number] DOB: 04/09/1940    LOS: 2  REFERRING MD :  Gabrielle Dare. Janee Morn, MD  CHIEF COMPLAINT:  Pulmonary edema  BRIEF PATIENT DESCRIPTION:  72yo F s/p sigmoidectomy 2/2 diverticulosis, extubated post op, and requiring reintubation shortly thereafter likely from negative pressure pulmonary edema.  Significant PMHx of HTN, Hypothyroidism, DM, Depression, Hyperlipidemia, Mitral stenosis  LINES / TUBES: ETT 1/27 >> 1/28  CULTURES: None  ANTIBIOTICS: Cefoxitin preop 1/27 >> 1/27  SIGNIFICANT EVENTS:  1/27 s/p sigmoidectomy 2/2 diverticulosis, extubated an re-intubated 2/2 pulmonary edema    INTERVAL HISTORY:  Denies chest pain.  Breathing better.  Intermittent cough.  Walked in hall today.  VITAL SIGNS: Temp:  [98.5 F (36.9 C)-98.9 F (37.2 C)] 98.6 F (37 C) (01/29 0827) Pulse Rate:  [63-83] 65  (01/29 1000) Resp:  [12-22] 15  (01/29 1000) BP: (86-125)/(33-52) 118/43 mmHg (01/29 1000) SpO2:  [90 %-99 %] 96 % (01/29 1000) INTAKE / OUTPUT: Intake/Output      01/28 0701 - 01/29 0700 01/29 0701 - 01/30 0700   I.V. (mL/kg) 690.5 (7.6) 120 (1.3)   NG/GT 90 30   IV Piggyback 2    Total Intake(mL/kg) 782.5 (8.6) 150 (1.7)   Urine (mL/kg/hr) 1875 (0.9) 160   Blood     Total Output 1875 160   Net -1092.5 -10         PHYSICAL EXAMINATION: General: No distress Neuro:  Follows commands HEENT: no sinus tenderness Cardiovascular: s1s2 regular Lungs: no wheeze/rales Abdomen: wound site clean Musculoskeletal: no edema Skin: No rashes  LABS: Cbc  Lab 05/04/12 0325 05/03/12 0430  WBC 7.7 --  HGB 10.6* 12.0  HCT 32.0* 34.7*  PLT 137* 176   Chemistry  Lab 05/04/12 0325 05/03/12 0430  NA 141 136  K 3.8 3.8  CL 104 100  CO2 28 24  BUN 21 19  CREATININE 1.25* 1.33*  CALCIUM 8.2* 8.2*  MG -- 1.6  PHOS -- 4.2  GLUCOSE 137* 168*   ABG  Lab 05/02/12 1807 05/02/12 1430  PHART 7.307* 7.233*  PCO2ART  39.6 50.6*  PO2ART 244.0* 95.2  HCO3 19.2* 20.6  TCO2 20.4 50.6   CBG trend  Lab 05/04/12 0824 05/04/12 0343 05/03/12 2340 05/03/12 1944 05/03/12 1549  GLUCAP 151* 126* 118* 162* 130*   IMAGING: Dg Chest Port 1 View  05/03/2012  *RADIOLOGY REPORT*  Clinical Data: Effusions and atelectasis.  PORTABLE CHEST - 1 VIEW  Comparison: 05/02/2012  Findings: Endotracheal tube is in good position.  NG tube has been inserted and the tip is below the diaphragm.  The pulmonary edema seen on the prior study has resolved.  Minimal residual effusion and atelectasis at the left lung base.  Right lung is clear.  IMPRESSION: Resolution of pulmonary edema.  Minimal atelectasis and effusion at the left base.   Original Report Authenticated By: Francene Boyers, M.D.    Dg Chest Port 1 View  05/02/2012  *RADIOLOGY REPORT*  Clinical Data: Respiratory failure.  Respiratory distress. Endotracheal tube placement.  PORTABLE CHEST - 1 VIEW  Comparison: 04/26/2012.  Findings: Endotracheal tube is present, new from the prior exam. The tip of the endotracheal tube is 41 mm from the carina.  There is diffuse bilateral left greater than right airspace disease, probably representing pulmonary edema.  This has a basilar predominance.  Basilar atelectasis is also present. Cardiopericardial silhouette appears unchanged allowing for projection.  Calcified lymph nodes in  the left hilum.  IMPRESSION:  1.  Endotracheal tube 41 mm from the carina. 2.  Diffuse bilateral perihilar and basilar predominant airspace disease is most compatible with pulmonary edema, probably due to volume overload.  Aspiration pneumonitis considered less likely.   Original Report Authenticated By: Andreas Newport, M.D.     ASSESSMENT / PLAN:  Acute respiratory failure likely 2nd to negative pressure pulmonary edema after extubation 1/27 >> resolved.  Acute pulmonary edema >> resolved. Cardiac enzymes negative.  Recent negative cardiac cath reported >> followed by  Dr. Garnette Scheuermann. Hx of HTN, Mitral stenosis, Hyperlipidemia. Plan: Continue IV lasix, metoprolol for now Resume avapro, crestor when able to take oral meds  S/p sigmoid colectomy for recurrent diverticulitis. Plan: Post-op care, nutrition, pain control per CCS  Hx of DM type II. Plan: SSI  Hx of Hypothyroidism. Plan: Continue synthroid  Agree with plan transfer to medical floor bed.  PCCM will sign off.  If further medical assistance needed, then please consult triad hospitalist team (Her PCP is Dr. Merri Brunette).  Coralyn Helling, MD [facility] Pulmonary/Critical Care 05/04/2012, 10:26 AM Pager:  [phone] After 3pm call: [phone]

## 2012-05-05 DIAGNOSIS — I10 Essential (primary) hypertension: Secondary | ICD-10-CM

## 2012-05-05 DIAGNOSIS — J95821 Acute postprocedural respiratory failure: Secondary | ICD-10-CM

## 2012-05-05 LAB — BASIC METABOLIC PANEL
BUN: 19 mg/dL (ref 6–23)
GFR calc non Af Amer: 51 mL/min — ABNORMAL LOW (ref >90)
Glucose, Bld: 136 mg/dL — ABNORMAL HIGH (ref 70–99)
Potassium: 3.4 mEq/L — ABNORMAL LOW (ref 3.5–5.1)

## 2012-05-05 LAB — GLUCOSE, CAPILLARY
Glucose-Capillary: 125 mg/dL — ABNORMAL HIGH (ref 70–99)
Glucose-Capillary: 135 mg/dL — ABNORMAL HIGH (ref 70–99)
Glucose-Capillary: 241 mg/dL — ABNORMAL HIGH (ref 70–99)
Glucose-Capillary: 175 mg/dL — ABNORMAL HIGH (ref 70–99)

## 2012-05-05 MED ORDER — HYDROMORPHONE HCL PF 1 MG/ML IJ SOLN
0.5000 mg | INTRAMUSCULAR | Status: DC | PRN
  Administered 2012-05-05: 1 mg via INTRAVENOUS
  Filled 2012-05-05: qty 1

## 2012-05-05 MED ORDER — OXYCODONE HCL 5 MG PO TABS
5.0000 mg | ORAL_TABLET | ORAL | Status: DC | PRN
  Administered 2012-05-06: 5 mg via ORAL
  Filled 2012-05-05: qty 1

## 2012-05-05 MED ORDER — POTASSIUM CHLORIDE CRYS ER 20 MEQ PO TBCR
20.0000 meq | EXTENDED_RELEASE_TABLET | Freq: Two times a day (BID) | ORAL | Status: AC
  Administered 2012-05-05 (×2): 20 meq via ORAL
  Filled 2012-05-05 (×2): qty 1

## 2012-05-05 NOTE — Progress Notes (Signed)
Patient ID: Elizabeth Nicholson, female   DOB: 1940-10-30, 72 y.o.   MRN: [id number] Up in chair, no pain.  No nausea. Feels like she may pass flatus soon.  Has ambulated twice. Patient examined and I agree with the assessment and plan  [name], MD, MPH, FACS Pager: [phone]  05/05/2012 3:47 PM

## 2012-05-05 NOTE — Progress Notes (Signed)
Physical Therapy Treatment Patient Details Name: Elizabeth Nicholson MRN: [id number] DOB: Jun 27, 1940 Today's Date: 05/05/2012 Time: 4098-1191 PT Time Calculation (min): 26 min  PT Assessment / Plan / Recommendation Comments on Treatment Session  Pt is making steady progress towards PT goals at this time. Patient able to increase ambulation distance significantly and reports very minimal pain. Anticipate pt will be MOD I level after next treatment session. Will continue to progress activity as tolerated.    Follow Up Recommendations  No PT follow up           Equipment Recommendations  None recommended by PT    Recommendations for Other Services    Frequency Min 3X/week   Plan Discharge plan remains appropriate    Precautions / Restrictions Restrictions Weight Bearing Restrictions: No   Pertinent Vitals/Pain No pain at this time    Mobility  Bed Mobility Bed Mobility: Rolling Right;Right Sidelying to Sit Rolling Right: 5: Supervision;With rail Right Sidelying to Sit: 5: Supervision;With rails;HOB elevated Transfers Transfers: Sit to Stand;Stand to Sit Sit to Stand: 5: Supervision;With upper extremity assist;From bed;From toilet Stand to Sit: 5: Supervision;With upper extremity assist;With armrests;To chair/3-in-1;To toilet Details for Transfer Assistance: VCs for safe hand placement Ambulation/Gait Ambulation/Gait Assistance: 5: Supervision Ambulation Distance (Feet): 400 Feet Assistive device: Rolling walker Ambulation/Gait Assistance Details: Steady with ambulation Gait Pattern: Step-through pattern Gait velocity: decreased Stairs: No      PT Goals Acute Rehab PT Goals PT Goal Formulation: With patient Time For Goal Achievement: 05/11/12 Potential to Achieve Goals: Good Pt will go Supine/Side to Sit: with modified independence PT Goal: Supine/Side to Sit - Progress: Progressing toward goal Pt will go Sit to Stand: with modified independence PT Goal: Sit to Stand -  Progress: Progressing toward goal Pt will go Stand to Sit: with modified independence PT Goal: Stand to Sit - Progress: Progressing toward goal Pt will Ambulate: >150 feet;with modified independence PT Goal: Ambulate - Progress: Progressing toward goal Pt will Go Up / Down Stairs: 3-5 stairs;with min assist  Visit Information  Last PT Received On: 05/05/12 Assistance Needed: +1    Subjective Data  Subjective: II would like to use the bathroom before we go Patient Stated Goal: to go home   Cognition  Overall Cognitive Status: Appears within functional limits for tasks assessed/performed Arousal/Alertness: Awake/alert Orientation Level: Appears intact for tasks assessed Behavior During Session: [facility] for tasks performed    Balance  Balance Balance Assessed: Yes High Level Balance High Level Balance Activites: Side stepping;Backward walking;Direction changes;Turns;Head turns High Level Balance Comments: steady with RW  End of Session PT - End of Session Equipment Utilized During Treatment: Gait belt Activity Tolerance: Patient tolerated treatment well Patient left: in chair;with call bell/phone within reach Nurse Communication: Mobility status (Pt safe to ambulate with family supervision)   GP     Fabio Asa 05/05/2012, 11:35 AM Charlotte Crumb, PT DPT  [phone]

## 2012-05-05 NOTE — Progress Notes (Signed)
3 Days Post-Op  Subjective: Not using PCA much, got up with therapies yesterday, no flatus  Objective: Vital signs in last 24 hours: Temp:  [97.8 F (36.6 C)-99 F (37.2 C)] 97.8 F (36.6 C) (01/30 0555) Pulse Rate:  [65-95] 85  (01/30 0555) Resp:  [12-21] 17  (01/30 0555) BP: (115-150)/(43-61) 150/57 mmHg (01/30 0555) SpO2:  [96 %-100 %] 97 % (01/30 0555) Weight:  [90.583 kg (199 lb 11.2 oz)] 90.583 kg (199 lb 11.2 oz) (01/29 1556) Last BM Date: 05/02/12  Intake/Output from previous day: 01/29 0701 - 01/30 0700 In: 997 [I.V.:905; NG/GT:90; IV Piggyback:2] Out: 1930 [Urine:1660; Emesis/NG output:270] Intake/Output this shift:    General appearance: alert and cooperative Nose: NGT Resp: clear to auscultation bilaterally Cardio: regular rate and rhythm GI: soft, NT, +some BS, dressing taken down, wound CDI Extremities: no edema Neuro: A&O  Lab Results:   Basename 05/04/12 0325 05/03/12 0430  WBC 7.7 12.6*  HGB 10.6* 12.0  HCT 32.0* 34.7*  PLT 137* 176   BMET  Basename 05/04/12 0325 05/03/12 0430  NA 141 136  K 3.8 3.8  CL 104 100  CO2 28 24  GLUCOSE 137* 168*  BUN 21 19  CREATININE 1.25* 1.33*  CALCIUM 8.2* 8.2*   PT/INR No results found for this basename: LABPROT:2,INR:2 in the last 72 hours ABG  Basename 05/02/12 1807 05/02/12 1430  PHART 7.307* 7.233*  HCO3 19.2* 20.6    Studies/Results: No results found.  Anti-infectives: Anti-infectives     Start     Dose/Rate Route Frequency Ordered Stop   05/02/12 0600   cefOXitin (MEFOXIN) 2 g in dextrose 5 % 50 mL IVPB        2 g 100 mL/hr over 30 Minutes Intravenous On call to O.R. 05/01/12 1336 05/02/12 1120          Assessment/Plan: s/p Procedure(s) (LRB) with comments: COLON RESECTION SIGMOID (N/A) S/P sigmoid colectomy for recurrent diverticulitis POD#5 Resp - stable FEN - lytes P Expected post-op ileus - Entereg, D/C NGT, sips clears VTE - SQH HTN IDDM - SSI, CBG 125-163 - will add  Lantus if CBG go over 180 Deconditioning - PT/OT  LOS: 3 days    Michelle Wnek E 05/05/2012

## 2012-05-06 LAB — GLUCOSE, CAPILLARY
Glucose-Capillary: 148 mg/dL — ABNORMAL HIGH (ref 70–99)
Glucose-Capillary: 216 mg/dL — ABNORMAL HIGH (ref 70–99)
Glucose-Capillary: 214 mg/dL — ABNORMAL HIGH (ref 70–99)

## 2012-05-06 MED ORDER — LEVOTHYROXINE SODIUM 112 MCG PO TABS
112.0000 ug | ORAL_TABLET | Freq: Every day | ORAL | Status: DC
  Administered 2012-05-06 – 2012-05-07 (×2): 112 ug via ORAL
  Filled 2012-05-06 (×3): qty 1

## 2012-05-06 MED ORDER — IBUPROFEN 600 MG PO TABS
600.0000 mg | ORAL_TABLET | Freq: Three times a day (TID) | ORAL | Status: DC | PRN
  Administered 2012-05-07: 600 mg via ORAL
  Filled 2012-05-06: qty 1

## 2012-05-06 MED ORDER — METOPROLOL SUCCINATE ER 50 MG PO TB24
50.0000 mg | ORAL_TABLET | Freq: Every day | ORAL | Status: DC
  Administered 2012-05-06 – 2012-05-07 (×2): 50 mg via ORAL
  Filled 2012-05-06 (×2): qty 1

## 2012-05-06 MED ORDER — DOCUSATE SODIUM 100 MG PO CAPS
100.0000 mg | ORAL_CAPSULE | Freq: Every day | ORAL | Status: DC
  Administered 2012-05-06 – 2012-05-07 (×2): 100 mg via ORAL

## 2012-05-06 MED ORDER — FUROSEMIDE 40 MG PO TABS
40.0000 mg | ORAL_TABLET | Freq: Every day | ORAL | Status: DC
  Administered 2012-05-06 – 2012-05-07 (×2): 40 mg via ORAL
  Filled 2012-05-06 (×2): qty 1

## 2012-05-06 MED ORDER — PANTOPRAZOLE SODIUM 40 MG PO TBEC
40.0000 mg | DELAYED_RELEASE_TABLET | Freq: Every day | ORAL | Status: DC
  Administered 2012-05-06: 40 mg via ORAL
  Filled 2012-05-06 (×2): qty 1

## 2012-05-06 NOTE — Progress Notes (Signed)
Patient ID: Elizabeth Nicholson, female   DOB: [birth year], 72 y.o.   MRN: [id number] Patient had 2 more BMs.  She requests ibuprofen instead of the oxycodone.  Hopefully will be ready for D/C in AM. I also spoke to her daughter. [name], MD, MPH, FACS Pager: [phone]

## 2012-05-06 NOTE — Progress Notes (Signed)
Occupational Therapy Treatment and Discharge Summary Patient Details Name: Elizabeth Nicholson MRN: [id number] DOB: 1940/04/08 Today's Date: 02-04-2013 Time: 6440-3474 OT Time Calculation (min): 19 min  OT Assessment / Plan / Recommendation Comments on Treatment Session Pt overall mod I with all adls and adl mobilty except for donning socks secondary to abdominal pain.  Pt states she will have assist with this at home.    Follow Up Recommendations  No OT follow up    Barriers to Discharge       Equipment Recommendations  None recommended by OT    Recommendations for Other Services    Frequency Min 2X/week   Plan Discharge plan remains appropriate    Precautions / Restrictions Precautions Precautions: None Precaution Comments: abdominal wound Restrictions Weight Bearing Restrictions: No   Pertinent Vitals/Pain Pt with 5/10 pain that gets better the longer she is up and moving.    ADL  Eating/Feeding: Performed;Independent Where Assessed - Eating/Feeding: Chair Grooming: Performed;Wash/dry hands;Teeth care;Modified independent Where Assessed - Grooming: Unsupported standing Toilet Transfer: Performed;Modified independent Toilet Transfer Method: Sit to stand;Stand pivot Toilet Transfer Equipment: Comfort height toilet;Grab bars Toileting - Clothing Manipulation and Hygiene: Performed;Modified independent Where Assessed - Toileting Clothing Manipulation and Hygiene: Standing Transfers/Ambulation Related to ADLs: Mod I in room w/o walker for all adls. ADL Comments: Can cross legs to get socks off, but not on at this time (reports that family will A her)    OT Diagnosis:    OT Problem List:   OT Treatment Interventions:     OT Goals Acute Rehab OT Goals OT Goal Formulation: With patient Time For Goal Achievement: 05/11/12 Potential to Achieve Goals: Good ADL Goals Pt Will Perform Grooming: Independently;Unsupported;Standing at sink ADL Goal: Grooming - Progress: Met Pt Will  Transfer to Toilet: Independently;Ambulation;Comfort height toilet;Grab bars ADL Goal: Toilet Transfer - Progress: Met Pt Will Perform Toileting - Clothing Manipulation: Independently;Standing ADL Goal: Toileting - Clothing Manipulation - Progress: Met Pt Will Perform Toileting - Hygiene: Independently;Standing at 3-in-1/toilet ADL Goal: Toileting - Hygiene - Progress: Met  Visit Information  Last OT Received On: 05/06/12 Assistance Needed: +1    Subjective Data      Prior Functioning       Cognition  Overall Cognitive Status: Appears within functional limits for tasks assessed/performed Arousal/Alertness: Awake/alert Orientation Level: Appears intact for tasks assessed Behavior During Session: [facility] for tasks performed Cognition - Other Comments: intact    Mobility  Shoulder Instructions Bed Mobility Bed Mobility: Supine to Sit Supine to Sit: 6: Modified independent (Device/Increase time);With rails Details for Bed Mobility Assistance: no assist needed. Transfers Transfers: Sit to Stand;Stand to Sit Sit to Stand: 6: Modified independent (Device/Increase time) Stand to Sit: 6: Modified independent (Device/Increase time) Details for Transfer Assistance: pt very safe and independent today       Exercises      Balance Balance Balance Assessed: No   End of Session OT - End of Session Activity Tolerance: Patient tolerated treatment well Patient left: in chair;with call bell/phone within reach;with family/visitor present Nurse Communication: Mobility status  GO     Hope Budds 02-04-2013, 12:04 PM

## 2012-05-06 NOTE — Progress Notes (Signed)
UR completed 

## 2012-05-06 NOTE — Progress Notes (Signed)
Physical Therapy Treatment Patient Details Name: Elizabeth Nicholson MRN: [id number] DOB: 1941/03/29 Today's Date: 01/31/2013 Time: 8469-6295 PT Time Calculation (min): 18 min  PT Assessment / Plan / Recommendation Comments on Treatment Session  Pt has made significant progress towards PT goals and is at Mod I.  Pt educated on car transfer techniques.  Pt demonstrates safe stair negotiation and ambulation. Pt has no further skilled PT needs at this time.     Follow Up Recommendations  No PT follow up     Does the patient have the potential to tolerate intense rehabilitation     Barriers to Discharge        Equipment Recommendations  None recommended by PT    Recommendations for Other Services    Frequency Min 3X/week   Plan Discharge plan remains appropriate    Precautions / Restrictions Precautions Precautions: None Precaution Comments: abdominal wound Restrictions Weight Bearing Restrictions: No   Pertinent Vitals/Pain 3/10    Mobility  Bed Mobility Bed Mobility: Not assessed (received in chair) Transfers Transfers: Sit to Stand;Stand to Sit Sit to Stand: 6: Modified independent (Device/Increase time) Stand to Sit: 6: Modified independent (Device/Increase time) Details for Transfer Assistance: pt very safe and independent today Ambulation/Gait Ambulation/Gait Assistance: 6: Modified independent (Device/Increase time) Ambulation Distance (Feet): 100 Feet Assistive device: None Ambulation/Gait Assistance Details: steady with ambulation no assist required Gait Pattern: Within Functional Limits Stairs: Yes Stairs Assistance: 5: Supervision Stairs Assistance Details (indicate cue type and reason): VC's for step to vs step over step Stair Management Technique: One rail Right;Forwards Number of Stairs: 8      PT Goals Acute Rehab PT Goals PT Goal Formulation: With patient PT Goal: Sit to Stand - Progress: Met PT Goal: Stand to Sit - Progress: Met PT Goal: Ambulate -  Progress: Met PT Goal: Up/Down Stairs - Progress: Met  Visit Information  Last PT Received On: 05/06/12 Assistance Needed: +1    Subjective Data  Subjective: I am very determined Patient Stated Goal: to go home   Cognition  Overall Cognitive Status: Appears within functional limits for tasks assessed/performed Arousal/Alertness: Awake/alert Orientation Level: Appears intact for tasks assessed Behavior During Session: [facility] for tasks performed Cognition - Other Comments: intact    Balance  Balance Balance Assessed: No  End of Session PT - End of Session Equipment Utilized During Treatment: Gait belt Activity Tolerance: Patient tolerated treatment well Patient left: in chair;with call bell/phone within reach Nurse Communication: Mobility status (Pt safe to ambulate with family supervision)   GP     Fabio Asa 01/31/2013, 3:40 PM Charlotte Crumb, PT DPT  [phone]

## 2012-05-06 NOTE — Progress Notes (Signed)
4 Days Post-Op  Subjective: 2 small BMs, passing a lot of gas, IV pain med overnight  Objective: Vital signs in last 24 hours: Temp:  [98.4 F (36.9 C)-99 F (37.2 C)] 98.4 F (36.9 C) (01/31 0530) Pulse Rate:  [66-85] 66  (01/31 0530) Resp:  [16-18] 16  (01/31 0530) BP: (119-138)/(51-57) 119/51 mmHg (01/31 0530) SpO2:  [96 %-98 %] 98 % (01/31 0530) Last BM Date: 05/05/12  Intake/Output from previous day: 01/30 0701 - 01/31 0700 In: 2203 [P.O.:960; I.V.:1243] Out: 100 [Urine:100] Intake/Output this shift:    General appearance: alert and cooperative Resp: clear to auscultation bilaterally Cardio: regular rate and rhythm GI: soft, incision CDI with mild ecchymosis, active BS  Lab Results:   [facility] 05/04/12 0325  WBC 7.7  HGB 10.6*  HCT 32.0*  PLT 137*   BMET  Basename 05/05/12 0535 05/04/12 0325  NA 146* 141  K 3.4* 3.8  CL 107 104  CO2 26 28  GLUCOSE 136* 137*  BUN 19 21  CREATININE 1.07 1.25*  CALCIUM 9.0 8.2*   PT/INR No results found for this basename: LABPROT:2,INR:2 in the last 72 hours ABG No results found for this basename: PHART:2,PCO2:2,PO2:2,HCO3:2 in the last 72 hours  Studies/Results: No results found.  Anti-infectives: Anti-infectives     Start     Dose/Rate Route Frequency Ordered Stop   05/02/12 0600   cefOXitin (MEFOXIN) 2 g in dextrose 5 % 50 mL IVPB        2 g 100 mL/hr over 30 Minutes Intravenous On call to O.R. 05/01/12 1336 05/02/12 1120          Assessment/Plan: s/p Procedure(s) (LRB) with comments: COLON RESECTION SIGMOID (N/A) S/P sigmoid colectomy for recurrent diverticulitis POD#8 FEN - advance diet and saline lock Expected post-op ileus - resolved VTE - SQH HTN IDDM - SSI Deconditioning - PT/OT Hope for D/C in AM  LOS: 4 days    Aslan Montagna E 05/06/2012

## 2012-05-07 NOTE — Discharge Summary (Signed)
Physician Discharge Summary  Patient ID: Elizabeth Nicholson MRN: [id number] DOB/AGE: Jan 10, 1941 72 y.o.  Admit date: 05/02/2012 Discharge date: 05/07/2012  Admission Diagnoses:Sigmoid diverticulitis  Discharge Diagnoses: Status post sigmoid colectomy Active Problems:  History of diverticulitis of colon  Pulmonary edema, acute  Acute respiratory failure  Hypoxemia  Hypokalemia   Discharged Condition: good  Hospital Course: Patient was admitted for sigmoid colectomy. She required reintubation in the recovery room due to pulmonary edema. She was diuresed overnight and successfully extubated the following day. CCM assisted in her critical care. Her bowel function gradually returned. She remained afebrile and clinically stable. She was on the Entereg protocol. Blood sugars were relatively controlled. She progressed well with physical and occupational therapy and was clear. She had several bowel movements and pain control was transitioned to oral medication. She is discharged home in stable condition.  Consults: pulmonary/intensive care  Significant Diagnostic Studies: labs and CXR  Treatments: surgery: sigmoid colectomy  Discharge Exam: Blood pressure 112/54, pulse 62, temperature 98.2 F (36.8 C), temperature source Oral, resp. rate 16, height 5' 6.5" (1.689 m), weight 90.583 kg (199 lb 11.2 oz), SpO2 98.00%. General appearance: alert and cooperative Resp: clear to auscultation bilaterally Cardio: regular rate and rhythm GI: soft, NT, ND, incision CDI, active BS Extremities: no edema  Disposition: 01-Home or Self Care  Discharge Orders    Future Orders Please Complete By Expires   Diet - low sodium heart healthy      Discharge instructions      Comments:   CCS      [facility], [place] [phone]  OPEN ABDOMINAL SURGERY: POST OP INSTRUCTIONS  Always review your discharge instruction sheet given to you by the facility where your surgery was performed.  IF YOU HAVE  DISABILITY OR FAMILY LEAVE FORMS, YOU MUST BRING THEM TO THE OFFICE FOR PROCESSING.  PLEASE DO NOT GIVE THEM TO YOUR DOCTOR.  A prescription for pain medication may be given to you upon discharge.  Take your pain medication as prescribed, if needed.  If narcotic pain medicine is not needed, then you may take acetaminophen (Tylenol) or ibuprofen (Advil) as needed. Take your usually prescribed medications unless otherwise directed. If you need a refill on your pain medication, please contact your pharmacy. They will contact our office to request authorization.  Prescriptions will not be filled after 5pm or on week-ends. You should follow a light diet the first few days after arrival home, such as soup and crackers, pudding, etc.unless your doctor has advised otherwise. A high-fiber, low fat diet can be resumed as tolerated.   Be sure to include lots of fluids daily. Most patients will experience some swelling and bruising on the chest and neck area.  Ice packs will help.  Swelling and bruising can take several days to resolve Most patients will experience some swelling and bruising in the area of the incision. Ice pack will help. Swelling and bruising can take several days to resolve..  It is common to experience some constipation if taking pain medication after surgery.  Increasing fluid intake and taking a stool softener will usually help or prevent this problem from occurring.  A mild laxative (Milk of Magnesia or Miralax) should be taken according to package directions if there are no bowel movements after 48 hours.  You may have steri-strips (small skin tapes) in place directly over the incision.  These strips should be left on the skin for 7-10 days.  If your surgeon used skin glue on  the incision, you may shower in 24 hours.  The glue will flake off over the next 2-3 weeks.  Any sutures or staples will be removed at the office during your follow-up visit. You may find that a light gauze bandage over  your incision may keep your staples from being rubbed or pulled. You may shower and replace the bandage daily. ACTIVITIES:  You may resume regular (light) daily activities beginning the next day-such as daily self-care, walking, climbing stairs-gradually increasing activities as tolerated.  You may have sexual intercourse when it is comfortable.  Refrain from any heavy lifting or straining until approved by your doctor. You may drive when you no longer are taking prescription pain medication, you can comfortably wear a seatbelt, and you can safely maneuver your car and apply brakes Return to Work: ___________________________________ Bonita Quin should see your doctor in the office for a follow-up appointment approximately two weeks after your surgery.  Make sure that you call for this appointment within a day or two after you arrive home to insure a convenient appointment time. OTHER INSTRUCTIONS:  _____________________________________________________________ _____________________________________________________________  WHEN TO CALL YOUR DOCTOR: Fever over 101.0 Inability to urinate Nausea and/or vomiting Extreme swelling or bruising Continued bleeding from incision. Increased pain, redness, or drainage from the incision. Difficulty swallowing or breathing Muscle cramping or spasms. Numbness or tingling in hands or feet or around lips.  The clinic staff is available to answer your questions during regular business hours.  Please don't hesitate to call and ask to speak to one of the nurses if you have concerns.  For further questions, please visit [email or web address]  OVER THE COUNTER MEDICATIONS AT DISCHARGE: IBUPROFEN 400-600MG  (2-3 TABS) EVERY EIGHT HOURS AS NEEDED FOR PAIN COLACE (DOCUSATE) 100MG  DAILY (STOOL SOFTENER)   No dressing needed      Lifting restrictions      Comments:   No lifting over 10lbs for 5 weeks       Medication List     As of 05/07/2012  7:53 AM    TAKE  these medications         furosemide 40 MG tablet   Commonly known as: LASIX   Take 1 tablet (40 mg total) by mouth daily.      insulin glargine 100 UNIT/ML injection   Commonly known as: LANTUS   Inject 40 Units into the skin at bedtime.      insulin lispro 100 UNIT/ML injection   Commonly known as: HUMALOG   Inject 12-22 Units into the skin 3 (three) times daily before meals. Use 12 units with breakfast and Lunch, and 18 units with dinner. Add 1 additional unit for every 30 points over 100.      irbesartan 150 MG tablet   Commonly known as: AVAPRO   Take 150 mg by mouth daily.      Krill Oil Caps   Take 1 capsule by mouth daily.      levothyroxine 112 MCG tablet   Commonly known as: SYNTHROID, LEVOTHROID   Take 112 mcg by mouth daily.      metoprolol succinate 50 MG 24 hr tablet   Commonly known as: TOPROL-XL   Take 1 tablet (50 mg total) by mouth daily. Take with or immediately following a meal.      ONE TOUCH ULTRA TEST test strip   Generic drug: glucose blood      ONETOUCH DELICA LANCETS Misc      OVER THE COUNTER MEDICATION   Take 2 tablets by  mouth daily. Mega antioxidant      potassium chloride SA 20 MEQ tablet   Commonly known as: K-DUR,KLOR-CON   Take 1 tablet (20 mEq total) by mouth daily.      PROBIOTIC DAILY PO   Take 1 tablet by mouth daily.      rosuvastatin 5 MG tablet   Commonly known as: CRESTOR   Take 5 mg by mouth every Monday, Wednesday, and Friday.       Please see over the counter medications at the end of the discharge instructions.  My nurse will call you Monday to set up an appointment for staple removal later this week.  SignedLiz Malady 05/07/2012, 7:53 AM

## 2012-05-07 NOTE — Progress Notes (Signed)
Discharge instructions gone over with patient. No prescriptions needed.  Home medications gone over.   Follow up appointment to be made. Diet, activity, and incisional care gone over. Signs and symptoms of infection and worsening conditions gone over. My chart instructions gone over. Patient verbalized understanding of all instructions.

## 2012-05-12 ENCOUNTER — Ambulatory Visit (INDEPENDENT_AMBULATORY_CARE_PROVIDER_SITE_OTHER): Payer: Medicare Other | Admitting: General Surgery

## 2012-05-12 ENCOUNTER — Encounter (INDEPENDENT_AMBULATORY_CARE_PROVIDER_SITE_OTHER): Payer: Self-pay | Admitting: General Surgery

## 2012-05-12 VITALS — BP 123/80 | HR 64 | Temp 97.8°F | Resp 20 | Ht 66.5 in | Wt 196.8 lb

## 2012-05-12 DIAGNOSIS — Z4802 Encounter for removal of sutures: Secondary | ICD-10-CM

## 2012-05-12 NOTE — Patient Instructions (Signed)
Patient came in and has staple removal and I removed all the staple. The patient stated that she had a allergies to the steri streps, I placed gauge and a ABD pad over the wound. I told her that the wound looked good and that she can take a shower and when she gets out she can pat the area dry. She has a follow up apt on 05-25-12 to come back in to see Dr Janee Morn. I told her that if she needs [place] before then she can call here at the office

## 2012-05-17 ENCOUNTER — Encounter (INDEPENDENT_AMBULATORY_CARE_PROVIDER_SITE_OTHER): Payer: Self-pay

## 2012-05-25 ENCOUNTER — Encounter (INDEPENDENT_AMBULATORY_CARE_PROVIDER_SITE_OTHER): Payer: Self-pay | Admitting: General Surgery

## 2012-05-25 ENCOUNTER — Ambulatory Visit (INDEPENDENT_AMBULATORY_CARE_PROVIDER_SITE_OTHER): Payer: Medicare Other | Admitting: General Surgery

## 2012-05-25 VITALS — BP 122/58 | HR 64 | Temp 97.2°F | Resp 16 | Ht 66.5 in | Wt 191.0 lb

## 2012-05-25 DIAGNOSIS — Z9049 Acquired absence of other specified parts of digestive tract: Secondary | ICD-10-CM | POA: Insufficient documentation

## 2012-05-25 DIAGNOSIS — K5732 Diverticulitis of large intestine without perforation or abscess without bleeding: Secondary | ICD-10-CM | POA: Insufficient documentation

## 2012-05-25 NOTE — Progress Notes (Signed)
Subjective:     Patient ID: Elizabeth Nicholson, female   DOB: 1941/04/03, 72 y.o.   MRN: [id number]  HPI  Patient is status post sigmoid colectomy. She is doing well. She is no longer taking pain medication. She has no wound complaints. She is eating and moving her bowels daily. She has not had difficulty with her bowel movements. Her appetite is returning.  Review of Systems     Objective:   Physical Exam Abdomen soft and nontender. Lower midline incision is clean dry and intact. There is no evidence of infection. Bowel sounds are active    Assessment:     Doing well status post sigmoid colectomy.  No further Pulmonary symptoms.    Plan:     Return as needed. She may resume driving when she feels comfortable and able to operate the car as needed

## 2012-09-23 ENCOUNTER — Other Ambulatory Visit: Payer: Self-pay | Admitting: Family Medicine

## 2012-09-23 DIAGNOSIS — Z1231 Encounter for screening mammogram for malignant neoplasm of breast: Secondary | ICD-10-CM

## 2012-09-30 ENCOUNTER — Ambulatory Visit
Admission: RE | Admit: 2012-09-30 | Discharge: 2012-09-30 | Disposition: A | Discharge status: Home / Self Care | Payer: Medicare Other | Source: Ambulatory Visit | Attending: Family Medicine | Admitting: Family Medicine

## 2012-09-30 DIAGNOSIS — Z1231 Encounter for screening mammogram for malignant neoplasm of breast: Secondary | ICD-10-CM

## 2012-12-12 ENCOUNTER — Other Ambulatory Visit: Payer: Self-pay | Admitting: Gastroenterology

## 2012-12-13 ENCOUNTER — Ambulatory Visit
Admission: RE | Admit: 2012-12-13 | Discharge: 2012-12-13 | Disposition: A | Discharge status: Home / Self Care | Payer: Medicare Other | Source: Ambulatory Visit | Attending: Gastroenterology | Admitting: Gastroenterology

## 2013-01-21 ENCOUNTER — Encounter: Payer: Self-pay | Admitting: Interventional Cardiology

## 2013-01-25 ENCOUNTER — Ambulatory Visit (INDEPENDENT_AMBULATORY_CARE_PROVIDER_SITE_OTHER): Payer: Medicare Other | Admitting: Interventional Cardiology

## 2013-01-25 ENCOUNTER — Other Ambulatory Visit: Payer: Self-pay

## 2013-01-25 ENCOUNTER — Encounter: Payer: Self-pay | Admitting: Interventional Cardiology

## 2013-01-25 VITALS — BP 110/58 | HR 61 | Ht 66.0 in | Wt 201.0 lb

## 2013-01-25 DIAGNOSIS — I10 Essential (primary) hypertension: Secondary | ICD-10-CM

## 2013-01-25 DIAGNOSIS — I5032 Chronic diastolic (congestive) heart failure: Secondary | ICD-10-CM

## 2013-01-25 DIAGNOSIS — I050 Rheumatic mitral stenosis: Secondary | ICD-10-CM

## 2013-01-25 MED ORDER — FUROSEMIDE 40 MG PO TABS: 40.0000 mg | ORAL_TABLET | Freq: Every day | ORAL | Status: DC

## 2013-01-25 MED ORDER — POTASSIUM CHLORIDE CRYS ER 20 MEQ PO TBCR: 20.0000 meq | EXTENDED_RELEASE_TABLET | Freq: Every day | ORAL | Status: DC

## 2013-01-25 MED ORDER — POTASSIUM CHLORIDE CRYS ER 20 MEQ PO TBCR: 20.0000 meq | EXTENDED_RELEASE_TABLET | Freq: Every day | ORAL | Status: AC

## 2013-01-25 MED ORDER — FUROSEMIDE 40 MG PO TABS: 40.0000 mg | ORAL_TABLET | Freq: Every day | ORAL | Status: AC

## 2013-01-25 NOTE — Patient Instructions (Signed)
Your physician recommends that you continue on your current medications as directed. Please refer to the Current Medication list given to you today.  Your physician wants you to follow-up in: 1 year  You will receive a reminder letter in the mail two months in advance. If you don't receive a letter, please call our office to schedule the follow-up appointment.  Please have some one monitor you while you are a sleep, if noticed that your breathing pauses.please let our office or your primary care physician know.  Your physician discussed the importance of regular exercise and recommended that you start or continue a regular exercise program for good health. Try to walk 20-30 minutes 3 times a week  Try to follow a low sodium diet
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
## 2013-03-11 ENCOUNTER — Other Ambulatory Visit: Payer: Self-pay | Admitting: Interventional Cardiology

## 2014-03-15 ENCOUNTER — Encounter (HOSPITAL_COMMUNITY): Payer: Self-pay | Admitting: Interventional Cardiology

## 2015-03-28 IMAGING — US US ABDOMEN COMPLETE
1 series · 13 of 25 positions shown · non-contrast
Comparison: CT 03/15/2008.

CLINICAL DATA: History of elevation of liver function tests.
History of cholecystectomy.

ABDOMINAL ULTRASOUND COMPLETE

[Series 1: us abdomen complete · 0.24mm/px · 13 of 75 slices shown]
[im 1/75]
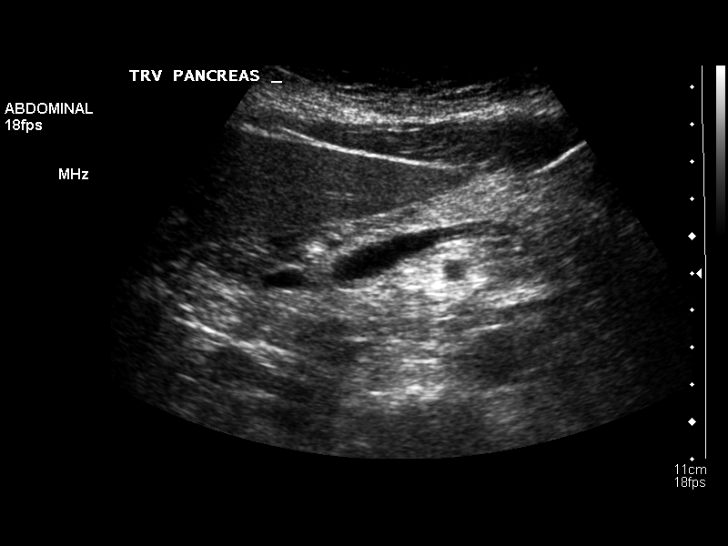
[im 7/75]
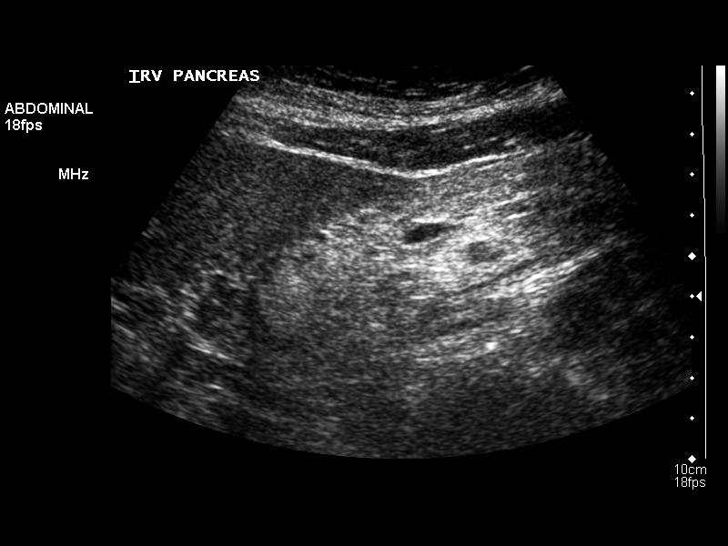
[im 13/75]
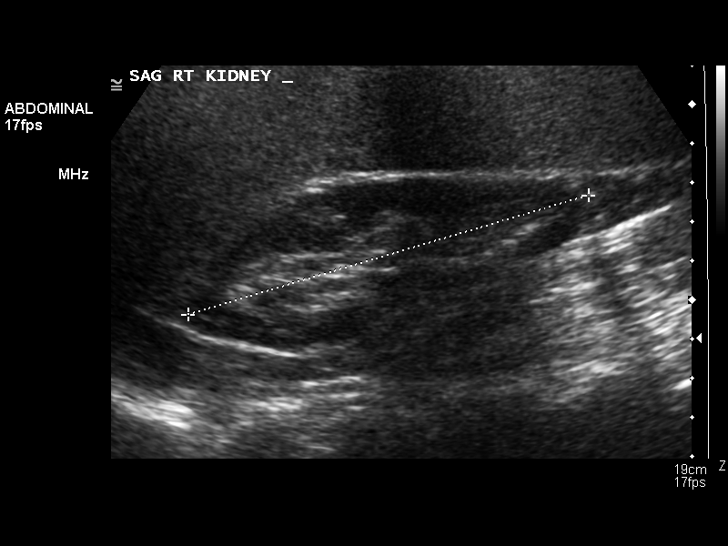
[im 19/75]
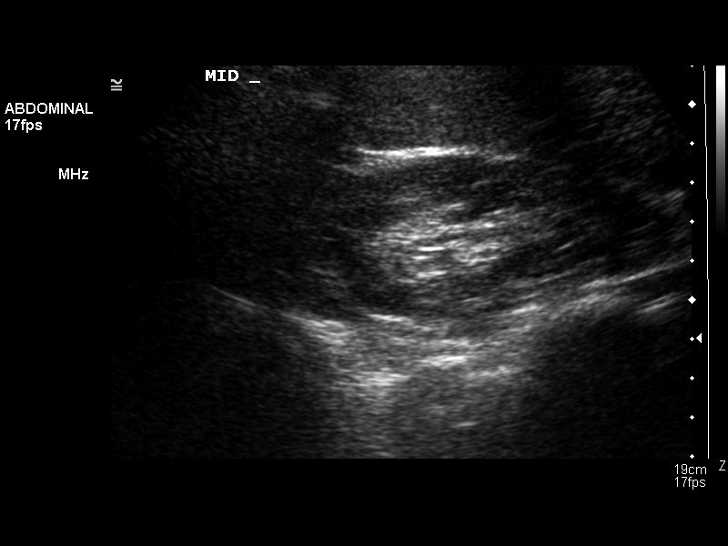
[im 25/75]
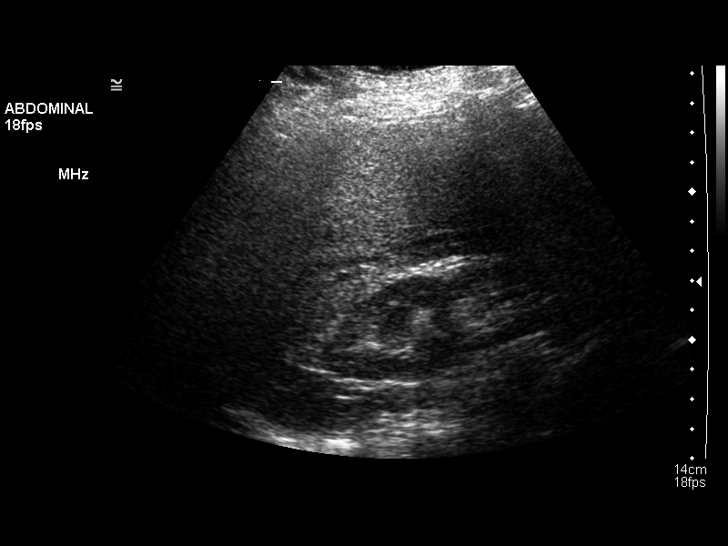
[im 31/75]
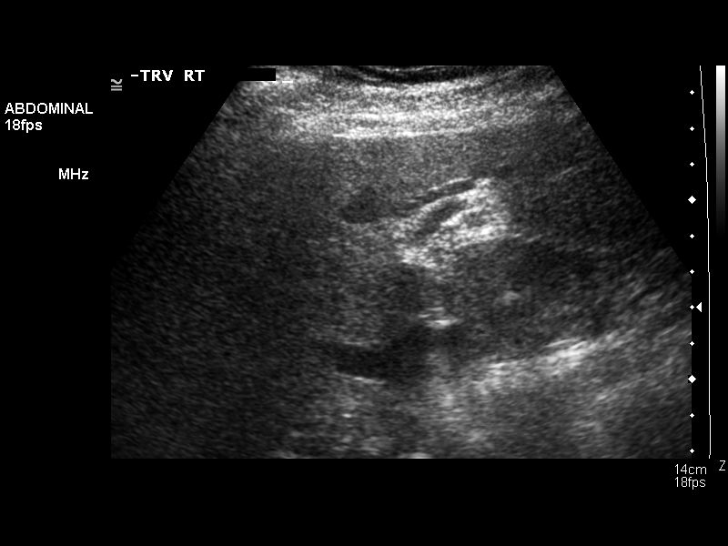
[im 38/75]
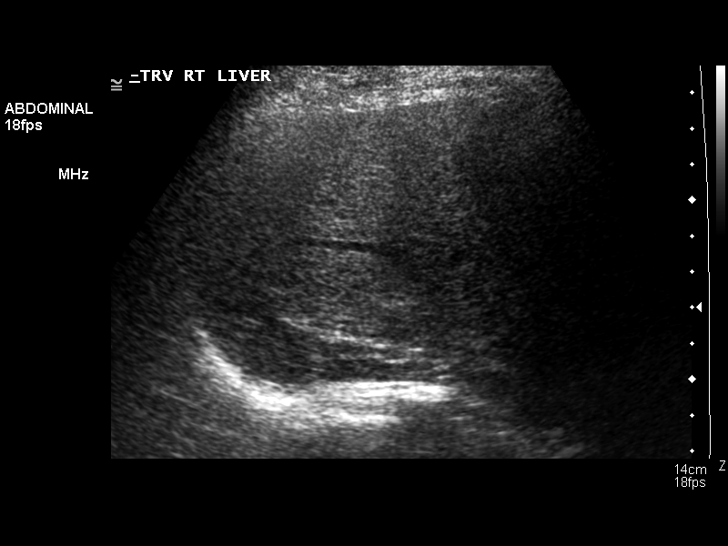
[im 44/75]
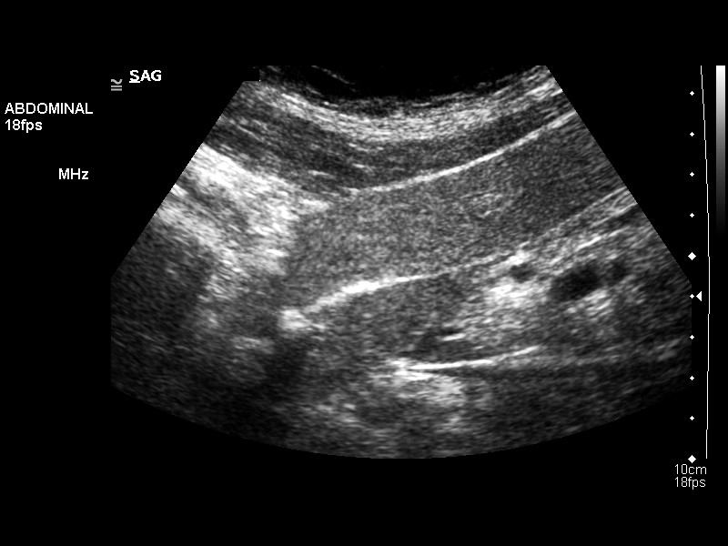
[im 50/75]
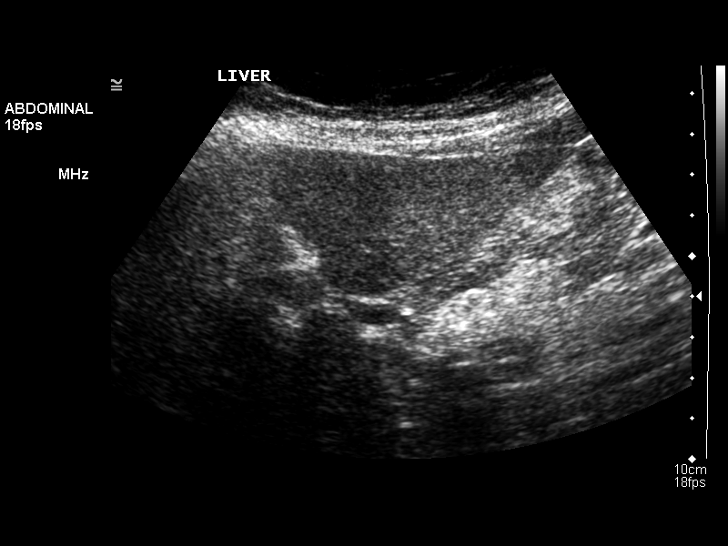
[im 56/75]
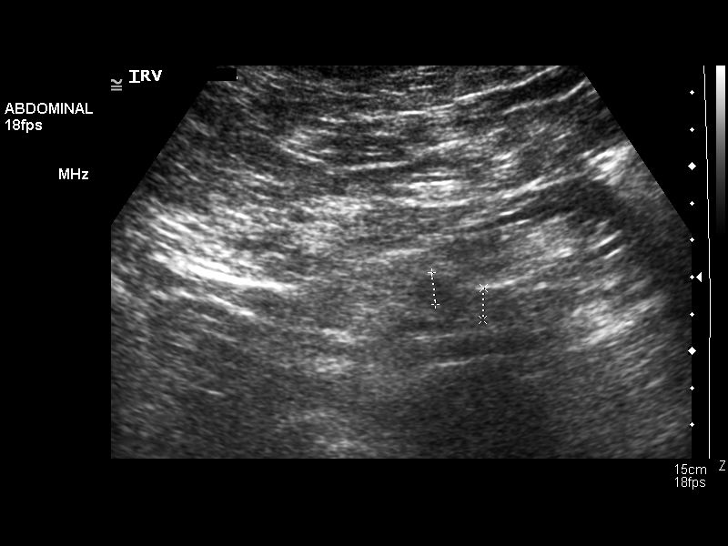
[im 62/75]
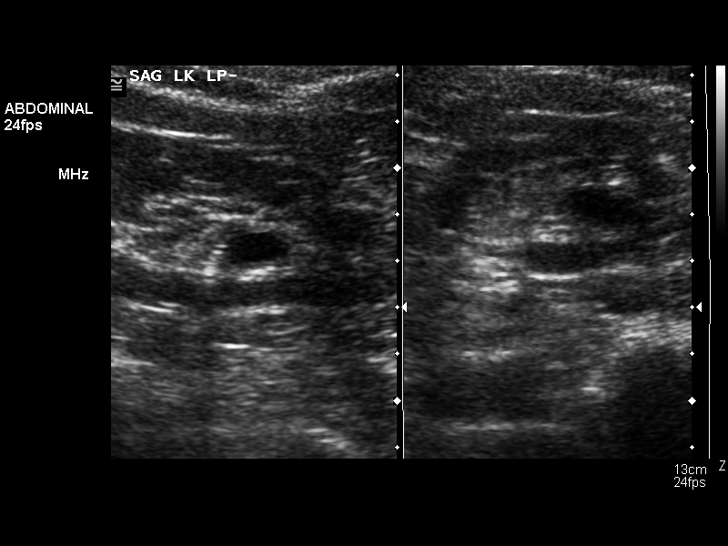
[im 68/75]
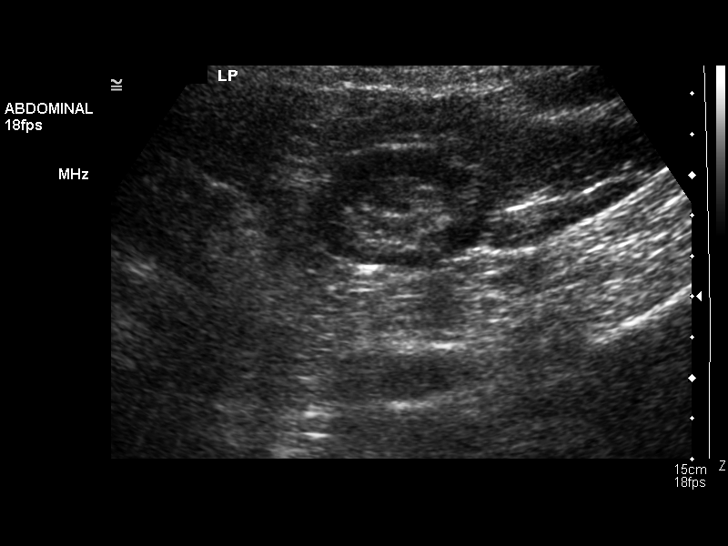
[im 75/75]
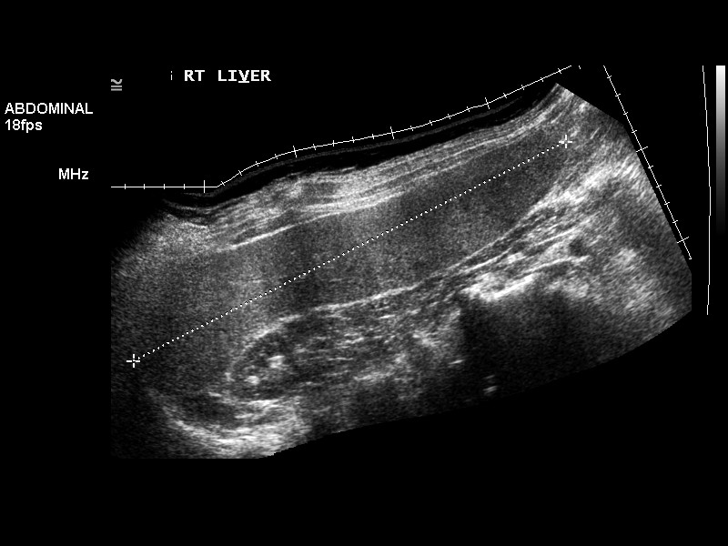

[13 of 25 positions shown; findings below may reference images not displayed]

FINDINGS: Gallbladder: There is history of prior cholecystectomy.  No
gallbladder was identified.

CBD: Normal in caliber measuring 5.2 mm. No choledocholithiasis is
evident.

Liver:  Normal size with slight inhomogeneous increase  in
echogenicity of the hepatic parenchymal echotexture without focal
parenchymal abnormality. Hepatic veins are patent.  Portal vein is
patent with hepatopetal flow. There is elongation of the right lobe
of the liver consistent with Joon Seok Wieser lobe configuration
unchanged from previous CT.  This is a normal variant.

IVC:  Patent throughout its visualized course in the abdomen.

Pancreas:  Although the pancreas is difficult to visualize in its
entirety, no focal pancreatic abnormality is identified. Area of
tail of pancreas was obscured by overlying bowel gas.

Spleen: Splenic length is 12.7 cm.  Transverse diameters are 12.5 x
5.9 cm.  Volume is 468 ml.

Right kidney:  No hydronephrosis.  Well-preserved cortex.  Normal
parenchymal echotexture without focal abnormalities.  Right renal
length is 10.7 cm. No focal splenic abnormality is evident.

Left kidney:  No hydronephrosis.  Well-preserved cortex.  Normal
parenchymal echotexture. There is a small cyst involving the lower
pole of the left kidney. The cyst appears simple.  The cyst
measures 1.7 x 1.2 x 0.9 cm..  Left renal length is 12.6 cm.

Aorta:  Maximum diameter is 1.9 cm.  No aneurysm is evident.
Proximal portion is partially obscured by bowel gas.

Ascites:  None.
IMPRESSION: Post cholecystectomy.Normal appearance of bile ducts.

 There is slight inhomogeneous increase in echogenicity of the
hepatic parenchymal echotexture without focal parenchymal
abnormality. Most commonly this is associated with hepatic
steatosis.  It is not specific for it but on the previous CT there
was decreased attenuation of the parenchyma of the liver compared
to the spleen consistent with hepatic steatosis.

Hepatic veins are patent.  Portal vein is patent with hepatopetal
flow. There is elongation of the right lobe of the liver consistent
with Joon Seok Wieser lobe configuration unchanged from previous CT.
This is a normal variant.

Splenic length is 12.7 cm.  Transverse diameters are 12.5 x 5.9 cm.
Volume is 468 ml. This is borderline size.

Lower pole renal cyst on the left.

## 2015-11-26 ENCOUNTER — Encounter: Payer: Self-pay | Admitting: Interventional Cardiology

## 2015-12-03 ENCOUNTER — Encounter (INDEPENDENT_AMBULATORY_CARE_PROVIDER_SITE_OTHER): Payer: Self-pay

## 2015-12-03 ENCOUNTER — Encounter: Payer: Self-pay | Admitting: Interventional Cardiology

## 2015-12-03 ENCOUNTER — Ambulatory Visit (INDEPENDENT_AMBULATORY_CARE_PROVIDER_SITE_OTHER): Payer: Medicare Other | Admitting: Interventional Cardiology

## 2015-12-03 VITALS — BP 134/62 | HR 73 | Ht 66.0 in | Wt 214.8 lb

## 2015-12-03 DIAGNOSIS — I10 Essential (primary) hypertension: Secondary | ICD-10-CM | POA: Diagnosis not present

## 2015-12-03 DIAGNOSIS — I050 Rheumatic mitral stenosis: Secondary | ICD-10-CM | POA: Diagnosis not present

## 2015-12-03 DIAGNOSIS — I5032 Chronic diastolic (congestive) heart failure: Secondary | ICD-10-CM | POA: Diagnosis not present

## 2015-12-03 DIAGNOSIS — E785 Hyperlipidemia, unspecified: Secondary | ICD-10-CM | POA: Diagnosis not present

## 2015-12-03 NOTE — Patient Instructions (Signed)
Medication Instructions:  Your physician recommends that you continue on your current medications as directed. Please refer to the Current Medication list given to you today.   Labwork: None   Testing/Procedures: none  Follow-Up: Your physician wants you to follow-up in: 1 year with Dr Katrinka BlazingSmith. (August 2018). You will receive a reminder letter in the mail two months in advance. If you don't receive a letter, please call our office to schedule the follow-up appointment.   Any Other Special Instructions Will Be Listed Below (If Applicable). Call our office if you have any prolonged chest pain with shortness of breath or prolonged palpitations    If you need a refill on your cardiac medications before your next appointment, please call your pharmacy.
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
## 2016-07-21 ENCOUNTER — Other Ambulatory Visit: Payer: Self-pay | Admitting: Internal Medicine

## 2016-07-21 DIAGNOSIS — Z8639 Personal history of other endocrine, nutritional and metabolic disease: Secondary | ICD-10-CM

## 2016-07-27 ENCOUNTER — Ambulatory Visit
Admission: RE | Admit: 2016-07-27 | Discharge: 2016-07-27 | Disposition: A | Discharge status: Home / Self Care | Payer: Medicare Other | Source: Ambulatory Visit | Attending: Internal Medicine | Admitting: Internal Medicine

## 2016-07-27 DIAGNOSIS — Z8639 Personal history of other endocrine, nutritional and metabolic disease: Secondary | ICD-10-CM
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
## 2016-12-02 ENCOUNTER — Encounter (INDEPENDENT_AMBULATORY_CARE_PROVIDER_SITE_OTHER): Payer: Self-pay

## 2016-12-02 ENCOUNTER — Ambulatory Visit (INDEPENDENT_AMBULATORY_CARE_PROVIDER_SITE_OTHER): Payer: Medicare Other | Admitting: Interventional Cardiology

## 2016-12-02 ENCOUNTER — Encounter: Payer: Self-pay | Admitting: Interventional Cardiology

## 2016-12-02 VITALS — BP 126/62 | HR 68 | Ht 66.0 in | Wt 215.2 lb

## 2016-12-02 DIAGNOSIS — I10 Essential (primary) hypertension: Secondary | ICD-10-CM | POA: Diagnosis not present

## 2016-12-02 DIAGNOSIS — I5032 Chronic diastolic (congestive) heart failure: Secondary | ICD-10-CM | POA: Diagnosis not present

## 2016-12-02 DIAGNOSIS — E1159 Type 2 diabetes mellitus with other circulatory complications: Secondary | ICD-10-CM | POA: Diagnosis not present

## 2016-12-02 DIAGNOSIS — I050 Rheumatic mitral stenosis: Secondary | ICD-10-CM

## 2016-12-02 DIAGNOSIS — R0602 Shortness of breath: Secondary | ICD-10-CM

## 2016-12-02 DIAGNOSIS — E7801 Familial hypercholesterolemia: Secondary | ICD-10-CM

## 2016-12-02 LAB — PRO B NATRIURETIC PEPTIDE: NT-Pro BNP: 424 pg/mL (ref 0–738)

## 2016-12-02 LAB — CBC
HEMATOCRIT: 39 % (ref 34.0–46.6)
HEMOGLOBIN: 13.2 g/dL (ref 11.1–15.9)
MCH: 30.9 pg (ref 26.6–33.0)
MCHC: 33.8 g/dL (ref 31.5–35.7)
MCV: 91 fL (ref 79–97)
Platelets: 292 10*3/uL (ref 150–379)
RBC: 4.27 x10E6/uL (ref 3.77–5.28)
RDW: 13.7 % (ref 12.3–15.4)
WBC: 7.8 10*3/uL (ref 3.4–10.8)

## 2016-12-02 LAB — COMPREHENSIVE METABOLIC PANEL
ALBUMIN: 4.1 g/dL (ref 3.5–4.8)
ALK PHOS: 80 IU/L (ref 39–117)
ALT: 20 IU/L (ref 0–32)
AST: 20 IU/L (ref 0–40)
Albumin/Globulin Ratio: 1.5 (ref 1.2–2.2)
BILIRUBIN TOTAL: 0.3 mg/dL (ref 0.0–1.2)
BUN / CREAT RATIO: 18 (ref 12–28)
BUN: 21 mg/dL (ref 8–27)
CHLORIDE: 101 mmol/L (ref 96–106)
CO2: 22 mmol/L (ref 20–29)
CREATININE: 1.17 mg/dL — AB (ref 0.57–1.00)
Calcium: 8.9 mg/dL (ref 8.7–10.3)
GFR calc non Af Amer: 45 mL/min/{1.73_m2} — ABNORMAL LOW (ref >59)
GFR, EST AFRICAN AMERICAN: 52 mL/min/{1.73_m2} — AB (ref >59)
GLOBULIN, TOTAL: 2.8 g/dL (ref 1.5–4.5)
GLUCOSE: 115 mg/dL — AB (ref 65–99)
Potassium: 4.6 mmol/L (ref 3.5–5.2)
SODIUM: 141 mmol/L (ref 134–144)
TOTAL PROTEIN: 6.9 g/dL (ref 6.0–8.5)

## 2016-12-02 NOTE — Patient Instructions (Signed)
Medication Instructions:  None  Labwork: CMET, Pro BNP and  CBC today  Testing/Procedures: Your physician has requested that you have an echocardiogram. Echocardiography is a painless test that uses sound waves to create images of your heart. It provides your doctor with information about the size and shape of your heart and how well your heart's chambers and valves are working. This procedure takes approximately one hour. There are no restrictions for this procedure.    Follow-Up: Your physician recommends that you schedule a follow-up appointment in: 4-6 weeks with a PA or NP or Dr. Katrinka BlazingSmith.  Your physician wants you to follow-up in: 1 year with Dr. Katrinka BlazingSmith. You will receive a reminder letter in the mail two months in advance. If you don't receive a letter, please call our office to schedule the follow-up appointment.    Any Other Special Instructions Will Be Listed Below (If Applicable).     If you need a refill on your cardiac medications before your next appointment, please call your pharmacy.

## 2016-12-03 ENCOUNTER — Telehealth: Payer: Self-pay | Admitting: *Deleted

## 2016-12-03 NOTE — Telephone Encounter (Signed)
-----   Message from Lyn RecordsHenry W Smith, MD sent at 12/03/2016 12:51 PM EDT ----- Let the patient know the labs don't show very much in the way of abnormality. Awaiting the results of the echocardiogram. A copy will be sent to Merri BrunetteSmith, Candace, MD

## 2016-12-03 NOTE — Telephone Encounter (Signed)
Pt has been notified of lab results by phone with verbal understanding. Pt asked for a copy of results to be mailed to her as well. Pt address has been verified. I will fax a copy of results to PCP as well. Pt is scheduled tomorrow 8/31 for echo. Pt thanked me for call today.

## 2016-12-04 ENCOUNTER — Ambulatory Visit (HOSPITAL_COMMUNITY): Payer: Medicare Other | Attending: Interventional Cardiology

## 2016-12-04 ENCOUNTER — Other Ambulatory Visit: Payer: Self-pay

## 2016-12-04 DIAGNOSIS — R0602 Shortness of breath: Secondary | ICD-10-CM | POA: Diagnosis not present

## 2016-12-04 DIAGNOSIS — E119 Type 2 diabetes mellitus without complications: Secondary | ICD-10-CM | POA: Insufficient documentation

## 2016-12-04 DIAGNOSIS — I110 Hypertensive heart disease with heart failure: Secondary | ICD-10-CM | POA: Insufficient documentation

## 2016-12-04 DIAGNOSIS — E785 Hyperlipidemia, unspecified: Secondary | ICD-10-CM | POA: Diagnosis not present

## 2016-12-04 DIAGNOSIS — Z8249 Family history of ischemic heart disease and other diseases of the circulatory system: Secondary | ICD-10-CM | POA: Insufficient documentation

## 2016-12-04 DIAGNOSIS — I050 Rheumatic mitral stenosis: Secondary | ICD-10-CM | POA: Diagnosis not present

## 2016-12-04 DIAGNOSIS — I340 Nonrheumatic mitral (valve) insufficiency: Secondary | ICD-10-CM | POA: Diagnosis not present

## 2016-12-04 DIAGNOSIS — I080 Rheumatic disorders of both mitral and aortic valves: Secondary | ICD-10-CM | POA: Insufficient documentation

## 2016-12-04 DIAGNOSIS — I509 Heart failure, unspecified: Secondary | ICD-10-CM | POA: Diagnosis not present

## 2016-12-09 ENCOUNTER — Telehealth: Payer: Self-pay | Admitting: *Deleted

## 2016-12-09 DIAGNOSIS — I5032 Chronic diastolic (congestive) heart failure: Secondary | ICD-10-CM

## 2016-12-09 MED ORDER — SPIRONOLACTONE 25 MG PO TABS: 12.5000 mg | ORAL_TABLET | Freq: Every day | ORAL | 3 refills | Status: DC

## 2016-12-09 MED ORDER — SPIRONOLACTONE 25 MG PO TABS: 25.0000 mg | ORAL_TABLET | Freq: Every day | ORAL | 3 refills | Status: DC

## 2016-12-09 MED ORDER — METOPROLOL SUCCINATE ER 25 MG PO TB24: 75.0000 mg | ORAL_TABLET | Freq: Every day | ORAL | 2 refills | Status: DC

## 2016-12-09 NOTE — Telephone Encounter (Signed)
Spoke with pt and went over echo results and recommendations per Dr. Katrinka BlazingSmith.  Educated pt on new medications/doses.  Pt will come for BMET on 9/17.  Pt verbalized understanding and was in agreement with this plan.

## 2016-12-09 NOTE — Telephone Encounter (Signed)
-----   Message from Lyn RecordsHenry W Smith, MD sent at 12/05/2016 11:16 AM EDT ----- Let the patient know mitral stenosis is worse. Please increase metoprolol succinate to 75 mg daily. Also add Aldactone 12.5 mg daily. Check BMET in 10 days. A copy will be sent to Merri BrunetteSmith, Candace, MD

## 2016-12-21 ENCOUNTER — Other Ambulatory Visit: Payer: Medicare Other

## 2016-12-24 ENCOUNTER — Other Ambulatory Visit: Payer: Medicare Other | Admitting: *Deleted

## 2016-12-24 DIAGNOSIS — I5032 Chronic diastolic (congestive) heart failure: Secondary | ICD-10-CM

## 2016-12-24 LAB — BASIC METABOLIC PANEL
BUN / CREAT RATIO: 19 (ref 12–28)
BUN: 23 mg/dL (ref 8–27)
CALCIUM: 8.7 mg/dL (ref 8.7–10.3)
CHLORIDE: 102 mmol/L (ref 96–106)
CO2: 20 mmol/L (ref 20–29)
Creatinine, Ser: 1.18 mg/dL — ABNORMAL HIGH (ref 0.57–1.00)
GFR calc non Af Amer: 45 mL/min/{1.73_m2} — ABNORMAL LOW (ref >59)
GFR, EST AFRICAN AMERICAN: 52 mL/min/{1.73_m2} — AB (ref >59)
GLUCOSE: 232 mg/dL — AB (ref 65–99)
POTASSIUM: 4.2 mmol/L (ref 3.5–5.2)
Sodium: 140 mmol/L (ref 134–144)

## 2017-01-04 ENCOUNTER — Encounter: Payer: Self-pay | Admitting: Interventional Cardiology

## 2017-01-04 ENCOUNTER — Encounter (INDEPENDENT_AMBULATORY_CARE_PROVIDER_SITE_OTHER): Payer: Self-pay

## 2017-01-04 ENCOUNTER — Ambulatory Visit (INDEPENDENT_AMBULATORY_CARE_PROVIDER_SITE_OTHER): Payer: Medicare Other | Admitting: Interventional Cardiology

## 2017-01-04 VITALS — BP 140/66 | HR 78 | Ht 66.0 in | Wt 215.1 lb

## 2017-01-04 DIAGNOSIS — I050 Rheumatic mitral stenosis: Secondary | ICD-10-CM

## 2017-01-04 DIAGNOSIS — R0683 Snoring: Secondary | ICD-10-CM | POA: Diagnosis not present

## 2017-01-04 DIAGNOSIS — I5032 Chronic diastolic (congestive) heart failure: Secondary | ICD-10-CM

## 2017-01-04 DIAGNOSIS — I10 Essential (primary) hypertension: Secondary | ICD-10-CM | POA: Diagnosis not present
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
## 2017-01-04 NOTE — Patient Instructions (Signed)
Medication Instructions:  Your physician recommends that you continue on your current medications as directed. Please refer to the Current Medication list given to you today.  Labwork: Your physician recommends that you return for lab work in: December (BMET and Pro BNP)   Testing/Procedures: Your physician has recommended that you have a sleep study. This test records several body functions during sleep, including: brain activity, eye movement, oxygen and carbon dioxide blood levels, heart rate and rhythm, breathing rate and rhythm, the flow of air through your mouth and nose, snoring, body muscle movements, and chest and belly movement.   Follow-Up: Your physician wants you to follow-up in: 6 months with Dr. Katrinka Blazing. You will receive a reminder letter in the mail two months in advance. If you don't receive a letter, please call our office to schedule the follow-up appointment.   Any Other Special Instructions Will Be Listed Below (If Applicable).     If you need a refill on your cardiac medications before your next appointment, please call your pharmacy.

## 2017-01-07 ENCOUNTER — Telehealth: Payer: Self-pay | Admitting: *Deleted

## 2017-01-07 NOTE — Telephone Encounter (Signed)
Informed patient of upcoming sleep study and patient understanding was verbalized. Patient understands her sleep study is scheduled for Tuesday February 23 2017. Patient understands her sleep study will be done at [facility] sleep lab. Patient understands she will receive a sleep packet in a week or so. Patient understands to call if she does not receive the sleep packet in a timely manner. Patient agrees with treatment and thanked me for call. 

## 2017-02-23 ENCOUNTER — Ambulatory Visit (HOSPITAL_BASED_OUTPATIENT_CLINIC_OR_DEPARTMENT_OTHER): Payer: Medicare Other | Attending: Interventional Cardiology | Admitting: Cardiology

## 2017-02-23 DIAGNOSIS — G4761 Periodic limb movement disorder: Secondary | ICD-10-CM | POA: Insufficient documentation

## 2017-02-23 DIAGNOSIS — I493 Ventricular premature depolarization: Secondary | ICD-10-CM | POA: Diagnosis not present

## 2017-02-23 DIAGNOSIS — R0683 Snoring: Secondary | ICD-10-CM | POA: Diagnosis not present

## 2017-03-15 ENCOUNTER — Other Ambulatory Visit: Payer: Medicare Other

## 2017-03-18 ENCOUNTER — Other Ambulatory Visit: Payer: Medicare Other

## 2017-03-22 ENCOUNTER — Telehealth: Payer: Self-pay | Admitting: *Deleted

## 2017-03-22 ENCOUNTER — Other Ambulatory Visit: Payer: Medicare Other | Admitting: *Deleted

## 2017-03-22 DIAGNOSIS — I5032 Chronic diastolic (congestive) heart failure: Secondary | ICD-10-CM

## 2017-03-22 LAB — BASIC METABOLIC PANEL
BUN/Creatinine Ratio: 22 (ref 12–28)
BUN: 29 mg/dL — ABNORMAL HIGH (ref 8–27)
CALCIUM: 8.7 mg/dL (ref 8.7–10.3)
CO2: 23 mmol/L (ref 20–29)
CREATININE: 1.32 mg/dL — AB (ref 0.57–1.00)
Chloride: 106 mmol/L (ref 96–106)
GFR calc Af Amer: 45 mL/min/{1.73_m2} — ABNORMAL LOW (ref >59)
GFR, EST NON AFRICAN AMERICAN: 39 mL/min/{1.73_m2} — AB (ref >59)
Glucose: 110 mg/dL — ABNORMAL HIGH (ref 65–99)
Potassium: 4.6 mmol/L (ref 3.5–5.2)
Sodium: 141 mmol/L (ref 134–144)

## 2017-03-22 LAB — PRO B NATRIURETIC PEPTIDE: NT-Pro BNP: 365 pg/mL (ref 0–738)

## 2017-03-22 NOTE — Progress Notes (Signed)
No Sleep Apnea.

## 2017-03-22 NOTE — Procedures (Signed)
   Patient Name: Elizabeth Nicholson, Elizabeth Nicholson Date: 02/23/2017 Gender: Female D.O.B: 1941/02/08 Age (years): 76 Referring Provider: Verdis PrimeHenry Smith Height (inches): 66 Interpreting Physician: Armanda Magicraci Turner MD, ABSM Weight (lbs): 219 RPSGT: Wylie HailDavis, Rico BMI: 35 MRN: [id number] Neck Size: 15.00  CLINICAL INFORMATION Sleep Nicholson Type: NPSG  Indication for sleep Nicholson: Snoring  Epworth Sleepiness Score: 4  SLEEP Nicholson TECHNIQUE As per the AASM Manual for the Scoring of Sleep and Associated Events v2.3 (April 2016) with a hypopnea requiring 4% desaturations.  The channels recorded and monitored were frontal, central and occipital EEG, electrooculogram (EOG), submentalis EMG (chin), nasal and oral airflow, thoracic and abdominal wall motion, anterior tibialis EMG, snore microphone, electrocardiogram, and pulse oximetry.  MEDICATIONS Medications self-administered by patient taken the night of the Nicholson : N/A  SLEEP ARCHITECTURE The Nicholson was initiated at 10:35:44 PM and ended at 4:42:16 AM.  Sleep onset time was 55.0 minutes and the sleep efficiency was 62.2%. The total sleep time was 228.0 minutes.  Stage REM latency was 252.0 minutes.  The patient spent 16.23% of the night in stage N1 sleep, 69.52% in stage N2 sleep, 0.00% in stage N3 and 14.25% in REM.  Alpha intrusion was absent.  Supine sleep was 0.00%.  RESPIRATORY PARAMETERS The overall apnea/hypopnea index (AHI) was 1.3 per hour. There were 0 total apneas, including 0 obstructive, 0 central and 0 mixed apneas. There were 5 hypopneas and 13 RERAs.  The AHI during Stage REM sleep was 3.7 per hour.  AHI while supine was N/A per hour.  The mean oxygen saturation was 92.53%. The minimum SpO2 during sleep was 89.00%.  moderate snoring was noted during this Nicholson.  CARDIAC DATA The 2 lead EKG demonstrated sinus rhythm. The mean heart rate was 63.78 beats per minute. Other EKG findings include: PVCs.  LEG MOVEMENT DATA The total  PLMS were 28 with a resulting PLMS index of 7.37. Associated arousal with leg movement index was 1.1 .  IMPRESSIONS - No significant obstructive sleep apnea occurred during this Nicholson (AHI = 1.3/h). - No significant central sleep apnea occurred during this Nicholson (CAI = 0.0/h). - The patient had minimal or no oxygen desaturation during the Nicholson (Min O2 = 89.00%) - The patient snored with moderate snoring volume. - EKG findings include PVCs. - Mild periodic limb movements of sleep occurred during the Nicholson. No significant associated arousals.  DIAGNOSIS - Periodic Limb Movment disorder - Snoring - PVCs  RECOMMENDATIONS - Avoid alcohol, sedatives and other CNS depressants that may worsen sleep apnea and disrupt normal sleep architecture. - Sleep hygiene should be reviewed to assess factors that may improve sleep quality. - Weight management and regular exercise should be initiated or continued if appropriate. - Consider evaluation for symptoms of restless legs syndrome. - Consider referral to ENT for evaluation of surgical causes of snoring.   Armanda Magicraci Turner Diplomate, American Board of Sleep Medicine  ELECTRONICALLY SIGNED ON:  03/22/2017, 4:50 PM [facility] PH: [phone]   FX: [phone] ACCREDITED BY THE AMERICAN ACADEMY OF SLEEP MEDICINE

## 2017-03-22 NOTE — Telephone Encounter (Signed)
-----   Message from Quintella Reichertraci R Turner, MD sent at 03/22/2017  4:53 PM EST ----- Please let patient know that she did not have any significant sleep apnea.  She does occasionally has leg movments and should talk with her PCP to determine if she may have restless legs.  Could consider referral to ENT for snoring if patient wishes.

## 2017-03-22 NOTE — Telephone Encounter (Signed)
Called LMTCB. 

## 2017-03-25 NOTE — Telephone Encounter (Signed)
Informed patient of sleep study results and patient understanding was verbalized. Patient understands she does not have any sleep apnea. Patient understands she does occasionally have leg movements and should talk to her PCP to determine if she may have restless legs.  Patient understands Dr Mayford Knifeurner is willing to refer her to an ENT for snoring. Patient has declined the restless legs suggestion and the ENT referral and thanked me for calling.

## 2018-04-11 ENCOUNTER — Encounter (HOSPITAL_COMMUNITY)
Admission: EM | Disposition: A | Discharge status: Home / Self Care | Payer: Self-pay | Source: Other Acute Inpatient Hospital | Attending: Cardiology

## 2018-04-11 ENCOUNTER — Encounter (HOSPITAL_COMMUNITY): Payer: Self-pay | Admitting: Emergency Medicine

## 2018-04-11 ENCOUNTER — Other Ambulatory Visit: Payer: Self-pay

## 2018-04-11 ENCOUNTER — Emergency Department (HOSPITAL_COMMUNITY): Payer: Medicare Other

## 2018-04-11 ENCOUNTER — Inpatient Hospital Stay (HOSPITAL_COMMUNITY)
Admission: EM | Admit: 2018-04-11 | Discharge: 2018-04-14 | DRG: 246 | Disposition: A | Discharge status: Home / Self Care | Payer: Medicare Other | Source: Other Acute Inpatient Hospital | Attending: Cardiology | Admitting: Cardiology

## 2018-04-11 DIAGNOSIS — Z882 Allergy status to sulfonamides status: Secondary | ICD-10-CM | POA: Diagnosis not present

## 2018-04-11 DIAGNOSIS — N183 Chronic kidney disease, stage 3 (moderate): Secondary | ICD-10-CM | POA: Diagnosis present

## 2018-04-11 DIAGNOSIS — Z794 Long term (current) use of insulin: Secondary | ICD-10-CM

## 2018-04-11 DIAGNOSIS — E039 Hypothyroidism, unspecified: Secondary | ICD-10-CM | POA: Diagnosis present

## 2018-04-11 DIAGNOSIS — I5033 Acute on chronic diastolic (congestive) heart failure: Secondary | ICD-10-CM | POA: Diagnosis present

## 2018-04-11 DIAGNOSIS — E1122 Type 2 diabetes mellitus with diabetic chronic kidney disease: Secondary | ICD-10-CM | POA: Diagnosis present

## 2018-04-11 DIAGNOSIS — F329 Major depressive disorder, single episode, unspecified: Secondary | ICD-10-CM | POA: Diagnosis present

## 2018-04-11 DIAGNOSIS — I130 Hypertensive heart and chronic kidney disease with heart failure and stage 1 through stage 4 chronic kidney disease, or unspecified chronic kidney disease: Secondary | ICD-10-CM | POA: Diagnosis present

## 2018-04-11 DIAGNOSIS — Z803 Family history of malignant neoplasm of breast: Secondary | ICD-10-CM

## 2018-04-11 DIAGNOSIS — I080 Rheumatic disorders of both mitral and aortic valves: Secondary | ICD-10-CM | POA: Diagnosis present

## 2018-04-11 DIAGNOSIS — Z8249 Family history of ischemic heart disease and other diseases of the circulatory system: Secondary | ICD-10-CM

## 2018-04-11 DIAGNOSIS — I2129 ST elevation (STEMI) myocardial infarction involving other sites: Secondary | ICD-10-CM | POA: Diagnosis not present

## 2018-04-11 DIAGNOSIS — Z833 Family history of diabetes mellitus: Secondary | ICD-10-CM

## 2018-04-11 DIAGNOSIS — E785 Hyperlipidemia, unspecified: Secondary | ICD-10-CM | POA: Diagnosis present

## 2018-04-11 DIAGNOSIS — Z79890 Hormone replacement therapy (postmenopausal): Secondary | ICD-10-CM

## 2018-04-11 DIAGNOSIS — Z9049 Acquired absence of other specified parts of digestive tract: Secondary | ICD-10-CM

## 2018-04-11 DIAGNOSIS — N179 Acute kidney failure, unspecified: Secondary | ICD-10-CM | POA: Diagnosis present

## 2018-04-11 DIAGNOSIS — I2510 Atherosclerotic heart disease of native coronary artery without angina pectoris: Secondary | ICD-10-CM | POA: Diagnosis present

## 2018-04-11 DIAGNOSIS — I214 Non-ST elevation (NSTEMI) myocardial infarction: Secondary | ICD-10-CM | POA: Diagnosis present

## 2018-04-11 DIAGNOSIS — Z79899 Other long term (current) drug therapy: Secondary | ICD-10-CM | POA: Diagnosis not present

## 2018-04-11 DIAGNOSIS — Z7982 Long term (current) use of aspirin: Secondary | ICD-10-CM | POA: Diagnosis not present

## 2018-04-11 DIAGNOSIS — Z820 Family history of epilepsy and other diseases of the nervous system: Secondary | ICD-10-CM | POA: Diagnosis not present

## 2018-04-11 DIAGNOSIS — Z888 Allergy status to other drugs, medicaments and biological substances status: Secondary | ICD-10-CM | POA: Diagnosis not present

## 2018-04-11 DIAGNOSIS — I10 Essential (primary) hypertension: Secondary | ICD-10-CM | POA: Diagnosis present

## 2018-04-11 DIAGNOSIS — I25110 Atherosclerotic heart disease of native coronary artery with unstable angina pectoris: Secondary | ICD-10-CM

## 2018-04-11 DIAGNOSIS — I5032 Chronic diastolic (congestive) heart failure: Secondary | ICD-10-CM | POA: Diagnosis present

## 2018-04-11 DIAGNOSIS — I050 Rheumatic mitral stenosis: Secondary | ICD-10-CM | POA: Diagnosis present

## 2018-04-11 DIAGNOSIS — E119 Type 2 diabetes mellitus without complications: Secondary | ICD-10-CM

## 2018-04-11 HISTORY — PX: LEFT HEART CATH AND CORONARY ANGIOGRAPHY: CATH118249

## 2018-04-11 HISTORY — PX: CORONARY STENT INTERVENTION: CATH118234

## 2018-04-11 LAB — COMPREHENSIVE METABOLIC PANEL
ALBUMIN: 4 g/dL (ref 3.5–5.0)
ALT: 19 U/L (ref 0–44)
AST: 22 U/L (ref 15–41)
Alkaline Phosphatase: 69 U/L (ref 38–126)
Anion gap: 10 (ref 5–15)
BUN: 38 mg/dL — ABNORMAL HIGH (ref 8–23)
CHLORIDE: 106 mmol/L (ref 98–111)
CO2: 21 mmol/L — ABNORMAL LOW (ref 22–32)
Calcium: 9.2 mg/dL (ref 8.9–10.3)
Creatinine, Ser: 1.76 mg/dL — ABNORMAL HIGH (ref 0.44–1.00)
GFR calc Af Amer: 32 mL/min — ABNORMAL LOW (ref >60)
GFR calc non Af Amer: 27 mL/min — ABNORMAL LOW (ref >60)
Glucose, Bld: 183 mg/dL — ABNORMAL HIGH (ref 70–99)
Potassium: 4.2 mmol/L (ref 3.5–5.1)
Sodium: 137 mmol/L (ref 135–145)
Total Bilirubin: 0.6 mg/dL (ref 0.3–1.2)
Total Protein: 7.3 g/dL (ref 6.5–8.1)

## 2018-04-11 LAB — I-STAT TROPONIN, ED: Troponin i, poc: 0.16 ng/mL (ref 0.00–0.08)

## 2018-04-11 LAB — CBC WITH DIFFERENTIAL/PLATELET
Abs Immature Granulocytes: 0.05 10*3/uL (ref 0.00–0.07)
BASOS ABS: 0.1 10*3/uL (ref 0.0–0.1)
Basophils Relative: 1 %
Eosinophils Absolute: 0.1 10*3/uL (ref 0.0–0.5)
Eosinophils Relative: 1 %
HCT: 38.5 % (ref 36.0–46.0)
Hemoglobin: 12.8 g/dL (ref 12.0–15.0)
Immature Granulocytes: 1 %
Lymphocytes Relative: 11 %
Lymphs Abs: 1.2 10*3/uL (ref 0.7–4.0)
MCH: 31.4 pg (ref 26.0–34.0)
MCHC: 33.2 g/dL (ref 30.0–36.0)
MCV: 94.6 fL (ref 80.0–100.0)
Monocytes Absolute: 0.5 10*3/uL (ref 0.1–1.0)
Monocytes Relative: 5 %
Neutro Abs: 8.6 10*3/uL — ABNORMAL HIGH (ref 1.7–7.7)
Neutrophils Relative %: 81 %
Platelets: 208 10*3/uL (ref 150–400)
RBC: 4.07 MIL/uL (ref 3.87–5.11)
RDW: 12.6 % (ref 11.5–15.5)
WBC: 10.5 10*3/uL (ref 4.0–10.5)
nRBC: 0 % (ref 0.0–0.2)

## 2018-04-11 LAB — PROTIME-INR
INR: 0.99
Prothrombin Time: 13 seconds (ref 11.4–15.2)

## 2018-04-11 SURGERY — LEFT HEART CATH AND CORONARY ANGIOGRAPHY
Anesthesia: LOCAL

## 2018-04-11 MED ORDER — HEPARIN (PORCINE) 25000 UT/250ML-% IV SOLN
1000.0000 [IU]/h | INTRAVENOUS | Status: DC
  Administered 2018-04-11: 1000 [IU]/h via INTRAVENOUS
  Filled 2018-04-11: qty 250

## 2018-04-11 MED ORDER — HYDRALAZINE HCL 20 MG/ML IJ SOLN: 5.0000 mg | INTRAMUSCULAR | Status: AC | PRN

## 2018-04-11 MED ORDER — LIDOCAINE HCL (PF) 1 % IJ SOLN
INTRAMUSCULAR | Status: DC | PRN
  Administered 2018-04-11: 2 mL

## 2018-04-11 MED ORDER — MIDAZOLAM HCL 2 MG/2ML IJ SOLN
INTRAMUSCULAR | Status: AC
  Filled 2018-04-11: qty 2

## 2018-04-11 MED ORDER — SODIUM CHLORIDE 0.9% FLUSH
3.0000 mL | Freq: Two times a day (BID) | INTRAVENOUS | Status: DC
  Administered 2018-04-12 – 2018-04-14 (×5): 3 mL via INTRAVENOUS

## 2018-04-11 MED ORDER — ASPIRIN EC 81 MG PO TBEC
81.0000 mg | DELAYED_RELEASE_TABLET | Freq: Every day | ORAL | Status: DC
  Administered 2018-04-12 – 2018-04-14 (×3): 81 mg via ORAL
  Filled 2018-04-11 (×3): qty 1

## 2018-04-11 MED ORDER — TIROFIBAN (AGGRASTAT) BOLUS VIA INFUSION
INTRAVENOUS | Status: DC | PRN
  Administered 2018-04-11: 2482.5 ug via INTRAVENOUS

## 2018-04-11 MED ORDER — HEPARIN BOLUS VIA INFUSION
4000.0000 [IU] | Freq: Once | INTRAVENOUS | Status: AC
  Administered 2018-04-11: 4000 [IU] via INTRAVENOUS
  Filled 2018-04-11: qty 4000

## 2018-04-11 MED ORDER — LEVOTHYROXINE SODIUM 112 MCG PO TABS
112.0000 ug | ORAL_TABLET | Freq: Every day | ORAL | Status: DC
  Administered 2018-04-12 – 2018-04-14 (×3): 112 ug via ORAL
  Filled 2018-04-11 (×3): qty 1

## 2018-04-11 MED ORDER — HEPARIN SODIUM (PORCINE) 1000 UNIT/ML IJ SOLN
INTRAMUSCULAR | Status: DC | PRN
  Administered 2018-04-11: 5000 [IU] via INTRAVENOUS
  Administered 2018-04-11: 2000 [IU] via INTRAVENOUS

## 2018-04-11 MED ORDER — HEPARIN (PORCINE) IN NACL 1000-0.9 UT/500ML-% IV SOLN
INTRAVENOUS | Status: AC
  Filled 2018-04-11: qty 1000

## 2018-04-11 MED ORDER — ONDANSETRON HCL 4 MG/2ML IJ SOLN: 4.0000 mg | Freq: Four times a day (QID) | INTRAMUSCULAR | Status: DC | PRN

## 2018-04-11 MED ORDER — MIDAZOLAM HCL 2 MG/2ML IJ SOLN
INTRAMUSCULAR | Status: DC | PRN
  Administered 2018-04-11: 1 mg via INTRAVENOUS

## 2018-04-11 MED ORDER — VERAPAMIL HCL 2.5 MG/ML IV SOLN
INTRAVENOUS | Status: DC | PRN
  Administered 2018-04-11: 10 mL via INTRA_ARTERIAL

## 2018-04-11 MED ORDER — TICAGRELOR 90 MG PO TABS
ORAL_TABLET | ORAL | Status: DC | PRN
  Administered 2018-04-11: 180 mg via ORAL

## 2018-04-11 MED ORDER — NITROGLYCERIN 1 MG/10 ML FOR IR/CATH LAB
INTRA_ARTERIAL | Status: DC | PRN
  Administered 2018-04-11 (×2): 200 ug via INTRACORONARY

## 2018-04-11 MED ORDER — BENZONATATE 100 MG PO CAPS: 100.0000 mg | ORAL_CAPSULE | Freq: Three times a day (TID) | ORAL | Status: DC | PRN

## 2018-04-11 MED ORDER — NITROGLYCERIN 0.4 MG SL SUBL
0.4000 mg | SUBLINGUAL_TABLET | SUBLINGUAL | Status: DC | PRN
  Administered 2018-04-11: 0.4 mg via SUBLINGUAL
  Filled 2018-04-11: qty 1

## 2018-04-11 MED ORDER — VERAPAMIL HCL 2.5 MG/ML IV SOLN
INTRAVENOUS | Status: AC
  Filled 2018-04-11: qty 2

## 2018-04-11 MED ORDER — ACETAMINOPHEN 325 MG PO TABS: 650.0000 mg | ORAL_TABLET | ORAL | Status: DC | PRN

## 2018-04-11 MED ORDER — LIDOCAINE HCL (PF) 1 % IJ SOLN
INTRAMUSCULAR | Status: AC
  Filled 2018-04-11: qty 30

## 2018-04-11 MED ORDER — TICAGRELOR 90 MG PO TABS
90.0000 mg | ORAL_TABLET | Freq: Two times a day (BID) | ORAL | Status: DC
  Administered 2018-04-12 – 2018-04-14 (×5): 90 mg via ORAL
  Filled 2018-04-11 (×5): qty 1

## 2018-04-11 MED ORDER — HEPARIN (PORCINE) IN NACL 1000-0.9 UT/500ML-% IV SOLN
INTRAVENOUS | Status: DC | PRN
  Administered 2018-04-11 (×3): 500 mL

## 2018-04-11 MED ORDER — SODIUM CHLORIDE 0.9 % IV SOLN
INTRAVENOUS | Status: AC
  Administered 2018-04-12: 01:00:00 via INTRAVENOUS

## 2018-04-11 MED ORDER — FUROSEMIDE 40 MG PO TABS
40.0000 mg | ORAL_TABLET | Freq: Every day | ORAL | Status: DC
  Administered 2018-04-12: 40 mg via ORAL
  Filled 2018-04-11: qty 1

## 2018-04-11 MED ORDER — IOHEXOL 350 MG/ML SOLN
INTRAVENOUS | Status: DC | PRN
  Administered 2018-04-11: 135 mL via INTRA_ARTERIAL

## 2018-04-11 MED ORDER — HEPARIN SODIUM (PORCINE) 1000 UNIT/ML IJ SOLN
INTRAMUSCULAR | Status: AC
  Filled 2018-04-11: qty 1

## 2018-04-11 MED ORDER — SODIUM CHLORIDE 0.9% FLUSH: 3.0000 mL | INTRAVENOUS | Status: DC | PRN

## 2018-04-11 MED ORDER — NITROGLYCERIN IN D5W 200-5 MCG/ML-% IV SOLN
0.0000 ug/min | INTRAVENOUS | Status: DC
  Administered 2018-04-12: 5 ug/min via INTRAVENOUS
  Filled 2018-04-11: qty 250

## 2018-04-11 MED ORDER — INSULIN ASPART PROT & ASPART (70-30 MIX) 100 UNIT/ML ~~LOC~~ SUSP
30.0000 [IU] | Freq: Every day | SUBCUTANEOUS | Status: DC
  Administered 2018-04-12 – 2018-04-13 (×2): 30 [IU] via SUBCUTANEOUS
  Filled 2018-04-11: qty 10

## 2018-04-11 MED ORDER — METOPROLOL SUCCINATE ER 50 MG PO TB24
75.0000 mg | ORAL_TABLET | Freq: Every day | ORAL | Status: DC
  Administered 2018-04-12 – 2018-04-14 (×3): 75 mg via ORAL
  Filled 2018-04-11 (×3): qty 1

## 2018-04-11 MED ORDER — POTASSIUM CHLORIDE CRYS ER 20 MEQ PO TBCR
20.0000 meq | EXTENDED_RELEASE_TABLET | Freq: Every day | ORAL | Status: DC
  Administered 2018-04-12 – 2018-04-14 (×3): 20 meq via ORAL
  Filled 2018-04-11 (×3): qty 1

## 2018-04-11 MED ORDER — FENTANYL CITRATE (PF) 100 MCG/2ML IJ SOLN
INTRAMUSCULAR | Status: AC
  Filled 2018-04-11: qty 2

## 2018-04-11 MED ORDER — NITROGLYCERIN 1 MG/10 ML FOR IR/CATH LAB
INTRA_ARTERIAL | Status: AC
  Filled 2018-04-11: qty 10

## 2018-04-11 MED ORDER — SODIUM CHLORIDE 0.9 % IV SOLN: 250.0000 mL | INTRAVENOUS | Status: DC | PRN

## 2018-04-11 MED ORDER — LABETALOL HCL 5 MG/ML IV SOLN: 10.0000 mg | INTRAVENOUS | Status: AC | PRN

## 2018-04-11 MED ORDER — TIROFIBAN HCL IN NACL 5-0.9 MG/100ML-% IV SOLN
INTRAVENOUS | Status: AC | PRN
  Administered 2018-04-11: 0.075 ug/kg/min via INTRAVENOUS

## 2018-04-11 MED ORDER — TIROFIBAN HCL IN NACL 5-0.9 MG/100ML-% IV SOLN
0.0750 ug/kg/min | INTRAVENOUS | Status: AC
  Administered 2018-04-12: 0.075 ug/kg/min via INTRAVENOUS

## 2018-04-11 SURGICAL SUPPLY — 16 items
BALLN SAPPHIRE 2.0X12 (BALLOONS) ×2
BALLOON SAPPHIRE 2.0X12 (BALLOONS) IMPLANT
CATH OPTITORQUE TIG 4.0 5F (CATHETERS) ×1 IMPLANT
CATH VISTA GUIDE 6FR XB3.5 (CATHETERS) ×1 IMPLANT
DEVICE RAD COMP TR BAND LRG (VASCULAR PRODUCTS) ×2 IMPLANT
ELECT DEFIB PAD ADLT CADENCE (PAD) ×1 IMPLANT
GLIDESHEATH SLEND SS 6F .021 (SHEATH) ×1 IMPLANT
GUIDEWIRE INQWIRE 1.5J.035X260 (WIRE) IMPLANT
INQWIRE 1.5J .035X260CM (WIRE) ×2
KIT HEART LEFT (KITS) ×2 IMPLANT
PACK CARDIAC CATHETERIZATION (CUSTOM PROCEDURE TRAY) ×2 IMPLANT
SHEATH PROBE COVER 6X72 (BAG) ×1 IMPLANT
STENT SYNERGY DES 2.5X16 (Permanent Stent) ×1 IMPLANT
TRANSDUCER W/STOPCOCK (MISCELLANEOUS) ×2 IMPLANT
TUBING CIL FLEX 10 FLL-RA (TUBING) ×2 IMPLANT
WIRE MINAMO 190 (WIRE) ×1 IMPLANT
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
## 2018-04-11 NOTE — Interval H&P Note (Signed)
History and Physical Interval Note:  04/11/2018 10:30 PM  Elizabeth Nicholson  has presented today for surgery, with the diagnosis of Urgent NSTEMI. The various methods of treatment have been discussed with the patient and family. After consideration of risks, benefits and other options for treatment, the patient has consented to  Procedure(s): LEFT HEART CATH AND CORONARY ANGIOGRAPHY (N/A) with possible PERCUTANEOUS CORONARY INTERVENTION as a surgical intervention .  The patient's history has been reviewed, patient examined, no change in status, stable for surgery.  I have reviewed the patient's chart and labs.  Questions were answered to the patient's satisfaction.    Cath Lab Visit (complete for each Cath Lab visit)  Clinical Evaluation Leading to the Procedure:   ACS: Yes.    Non-ACS:    Anginal Classification: CCS IV  Anti-ischemic medical therapy: Minimal Therapy (1 class of medications)  Non-Invasive Test Results: No non-invasive testing performed  Prior CABG: No previous CABG    Bryan Lemma

## 2018-04-11 NOTE — Progress Notes (Signed)
Patient needed to go to cath quickly.  Had prayer with her and family.  Helped them find place to eat and the waiting area for 2 Heart where the doctors will eventually come to give them a report following the procedure.  Family members were coming in to join with them.  Chaplain spent time with them and offered comfort and help getting around as they were not familiar with the renovated hospital. Chaplain will be happy to return to be with them if needed later. Phebe Colla, Chaplain   04/11/18 2300  Clinical Encounter Type  Visited With Patient;Family;Patient and family together  Visit Type Initial;Spiritual support;Pre-op;ED  Referral From Other (Comment) (Code [facility]emi Call)  Consult/Referral To Chaplain  Spiritual Encounters  Spiritual Needs Prayer;Other (Comment) (prayer with patient preparing to go to Cath Lab)  Stress Factors  Patient Stress Factors Health changes  Family Stress Factors Other (Comment) (family stressed, hungry, needed to wait and show way to 2 H)

## 2018-04-11 NOTE — ED Triage Notes (Signed)
[facility] with c/o substernal chest pain onset earlier tonight.  St's pain radiates through to back.  Pt was given NTG SL x's 2 by EMS.  Pt st's pain is much better at this tiime

## 2018-04-11 NOTE — ED Notes (Signed)
Paged stemi dr Herbie Baltimore to [place] @ [phone]
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
## 2018-04-11 NOTE — ED Notes (Signed)
Dr. Herbie Baltimore ( cardiologist ) explained admission and plan of care / cardiac catheterization to pt. and family .

## 2018-04-12 ENCOUNTER — Encounter (HOSPITAL_COMMUNITY): Payer: Self-pay | Admitting: Cardiology

## 2018-04-12 ENCOUNTER — Inpatient Hospital Stay (HOSPITAL_COMMUNITY): Payer: Medicare Other

## 2018-04-12 DIAGNOSIS — I5033 Acute on chronic diastolic (congestive) heart failure: Secondary | ICD-10-CM

## 2018-04-12 DIAGNOSIS — I214 Non-ST elevation (NSTEMI) myocardial infarction: Secondary | ICD-10-CM

## 2018-04-12 DIAGNOSIS — I2129 ST elevation (STEMI) myocardial infarction involving other sites: Secondary | ICD-10-CM

## 2018-04-12 LAB — CBC
HCT: 37.4 % (ref 36.0–46.0)
HCT: 38.4 % (ref 36.0–46.0)
Hemoglobin: 12.3 g/dL (ref 12.0–15.0)
Hemoglobin: 12.7 g/dL (ref 12.0–15.0)
MCH: 30.3 pg (ref 26.0–34.0)
MCH: 30.9 pg (ref 26.0–34.0)
MCHC: 32.9 g/dL (ref 30.0–36.0)
MCHC: 33.1 g/dL (ref 30.0–36.0)
MCV: 92.1 fL (ref 80.0–100.0)
MCV: 93.4 fL (ref 80.0–100.0)
Platelets: 219 K/uL (ref 150–400)
Platelets: 233 10*3/uL (ref 150–400)
RBC: 4.06 MIL/uL (ref 3.87–5.11)
RBC: 4.11 MIL/uL (ref 3.87–5.11)
RDW: 12.9 % (ref 11.5–15.5)
RDW: 13 % (ref 11.5–15.5)
WBC: 10 K/uL (ref 4.0–10.5)
WBC: 10 10*3/uL (ref 4.0–10.5)
nRBC: 0 % (ref 0.0–0.2)
nRBC: 0 % (ref 0.0–0.2)

## 2018-04-12 LAB — BASIC METABOLIC PANEL
Anion gap: 14 (ref 5–15)
BUN: 34 mg/dL — ABNORMAL HIGH (ref 8–23)
CO2: 19 mmol/L — ABNORMAL LOW (ref 22–32)
Calcium: 9.5 mg/dL (ref 8.9–10.3)
Chloride: 103 mmol/L (ref 98–111)
Creatinine, Ser: 1.47 mg/dL — ABNORMAL HIGH (ref 0.44–1.00)
GFR calc Af Amer: 39 mL/min — ABNORMAL LOW (ref >60)
GFR, EST NON AFRICAN AMERICAN: 34 mL/min — AB (ref >60)
Glucose, Bld: 147 mg/dL — ABNORMAL HIGH (ref 70–99)
Potassium: 4.3 mmol/L (ref 3.5–5.1)
SODIUM: 136 mmol/L (ref 135–145)

## 2018-04-12 LAB — GLUCOSE, CAPILLARY: Glucose-Capillary: 158 mg/dL — ABNORMAL HIGH (ref 70–99)

## 2018-04-12 LAB — ECHOCARDIOGRAM COMPLETE
Height: 66 in
Weight: 3504 oz

## 2018-04-12 LAB — TROPONIN I
TROPONIN I: 27.38 ng/mL — AB (ref <0.03)
Troponin I: 35.79 ng/mL (ref <0.03)

## 2018-04-12 LAB — MRSA PCR SCREENING: MRSA BY PCR: NEGATIVE

## 2018-04-12 LAB — POCT ACTIVATED CLOTTING TIME: Activated Clotting Time: 329 seconds

## 2018-04-12 MED ORDER — FUROSEMIDE 10 MG/ML IJ SOLN: 40.0000 mg | Freq: Once | INTRAMUSCULAR | Status: DC

## 2018-04-12 MED ORDER — FUROSEMIDE 40 MG PO TABS
40.0000 mg | ORAL_TABLET | Freq: Every day | ORAL | Status: DC
  Administered 2018-04-13 – 2018-04-14 (×2): 40 mg via ORAL
  Filled 2018-04-12 (×2): qty 1

## 2018-04-12 MED ORDER — PRAVASTATIN SODIUM 40 MG PO TABS
80.0000 mg | ORAL_TABLET | Freq: Every day | ORAL | Status: DC
  Administered 2018-04-12 – 2018-04-13 (×2): 80 mg via ORAL
  Filled 2018-04-12 (×2): qty 2

## 2018-04-12 NOTE — Progress Notes (Signed)
Progress Note  Patient Name: Elizabeth Nicholson Date of Encounter: 04/12/2018  Primary Cardiologist: No primary care provider on file. [place]  Subjective   No chest pain this am. Dyspnea when moving around.   Inpatient Medications    Scheduled Meds: . aspirin EC  81 mg Oral Daily  . furosemide  40 mg Oral Daily  . insulin aspart protamine- aspart  30 Units Subcutaneous Q supper  . levothyroxine  112 mcg Oral QAC breakfast  . metoprolol succinate  75 mg Oral Daily  . potassium chloride SA  20 mEq Oral Daily  . sodium chloride flush  3 mL Intravenous Q12H  . ticagrelor  90 mg Oral BID   Continuous Infusions: . sodium chloride    . nitroGLYCERIN Stopped (04/12/18 0601)   PRN Meds: sodium chloride, acetaminophen, benzonatate, nitroGLYCERIN, ondansetron (ZOFRAN) IV, sodium chloride flush   Vital Signs    Vitals:   04/12/18 0630 04/12/18 0700 04/12/18 0744 04/12/18 0800  BP: (!) 110/56 (!) 115/47  (!) 121/47  Pulse: 63 61  64  Resp: 16 14  15   Temp:   98 F (36.7 C)   TempSrc:   Oral   SpO2: 98% 96%  99%  Weight:      Height:        Intake/Output Summary (Last 24 hours) at 04/12/2018 0857 Last data filed at 04/12/2018 0851 Gross per 24 hour  Intake 872.41 ml  Output 2350 ml  Net -1477.59 ml   Filed Weights   04/11/18 2049  Weight: 99.3 kg    Telemetry    Sinus with short 3-4 beat runs of non-sustained VT - Personally Reviewed  ECG     NSR, no ischemic changes- Personally Reviewed  Physical Exam   GEN: No acute distress.   Neck: No JVD Cardiac: RRR, diastolic murmur/rumble.   Respiratory: Clear to auscultation bilaterally. GI: Soft, nontender, non-distended  Ext: No LE edema; No deformity. Neuro:  Nonfocal  Psych: Normal affect   Labs    Chemistry Recent Labs  Lab 04/11/18 2102  NA 137  K 4.2  CL 106  CO2 21*  GLUCOSE 183*  BUN 38*  CREATININE 1.76*  CALCIUM 9.2  PROT 7.3  ALBUMIN 4.0  AST 22  ALT 19  ALKPHOS 69  BILITOT 0.6  GFRNONAA  27*  GFRAA 32*  ANIONGAP 10     Hematology Recent Labs  Lab 04/11/18 2102  WBC 10.5  RBC 4.07  HGB 12.8  HCT 38.5  MCV 94.6  MCH 31.4  MCHC 33.2  RDW 12.6  PLT 208    Cardiac EnzymesNo results for input(s): TROPONINI in the last 168 hours.  Recent Labs  Lab 04/11/18 2105  TROPIPOC 0.16*     BNPNo results for input(s): BNP, PROBNP in the last 168 hours.   DDimer No results for input(s): DDIMER in the last 168 hours.   Radiology    Dg Chest Port 1 View  Result Date: 04/11/2018 CLINICAL DATA:  Chest pain. EXAM: PORTABLE CHEST 1 VIEW COMPARISON:  Radiograph May 03, 2012. FINDINGS: Stable cardiomegaly. No pneumothorax or pleural effusion is noted. Right lung is clear. Mild left basilar atelectasis, scarring or infiltrate is noted. Bony thorax is unremarkable. IMPRESSION: Mild left basilar scarring, atelectasis or infiltrate is noted. Electronically Signed   By: Lupita Raider, M.D.   On: 04/11/2018 21:25    Cardiac Studies     Patient Profile     78 y.o. female with history of mitral  stenosis, aortic insufficiency, diastolic chf, HTN, HLD admitted with an acute lateral STEMI.   Assessment & Plan    1. CAD/Acute lateral STEMI: Pt taken for emergent cardiac cath pm of 04/11/18. The OM was occluded. A drug eluting stent was placed. She is doing well this am with no chest pain. Mild dyspnea. Will give one dose of IV Lasix. (LVEDP 23 in the cath lab). Will continue DAPT with ASA and Brilinta. Will continue beta blocker. Will start high intensity statin. Echo today to assess LVEF. Follow renal function closely post cath. Will continue to hold Avapro and aldactone today.   2. Aortic valve insufficiency/Mitral valve stenosis: Echo today to assess  3. Acute on chronic diastolic CHF: Will given IV lasix today. Follow renal function closely.   For questions or updates, please contact [facility] Please consult [email or web address] for contact info under   Signed, Verne Carrow, MD  04/12/2018, 8:57 AM

## 2018-04-12 NOTE — Progress Notes (Signed)
Pt's home meds taken down to pharmacy. Home Med form in hard copy chart.

## 2018-04-12 NOTE — Plan of Care (Signed)
  Problem: Education: Goal: Knowledge of General Education information will improve Description Including pain rating scale, medication(s)/side effects and non-pharmacologic comfort measures Outcome: Progressing   Problem: Health Behavior/Discharge Planning: Goal: Ability to manage health-related needs will improve Outcome: Progressing   Problem: Clinical Measurements: Goal: Ability to maintain clinical measurements within normal limits will improve Outcome: Progressing Goal: Will remain free from infection Outcome: Progressing Goal: Diagnostic test results will improve Outcome: Progressing Goal: Respiratory complications will improve Outcome: Progressing Goal: Cardiovascular complication will be avoided Outcome: Progressing   Problem: Activity: Goal: Risk for activity intolerance will decrease Outcome: Progressing   Problem: Nutrition: Goal: Adequate nutrition will be maintained Outcome: Progressing   Problem: Coping: Goal: Level of anxiety will decrease Outcome: Progressing   Problem: Elimination: Goal: Will not experience complications related to bowel motility Outcome: Progressing   Problem: Pain Managment: Goal: General experience of comfort will improve Outcome: Progressing   Problem: Safety: Goal: Ability to remain free from injury will improve Outcome: Progressing   Problem: Skin Integrity: Goal: Risk for impaired skin integrity will decrease Outcome: Progressing   Problem: Education: Goal: Understanding of CV disease, CV risk reduction, and recovery process will improve Outcome: Progressing Goal: Individualized Educational Video(s) Outcome: Progressing   Problem: Activity: Goal: Ability to return to baseline activity level will improve Outcome: Progressing   Problem: Cardiovascular: Goal: Ability to achieve and maintain adequate cardiovascular perfusion will improve Outcome: Progressing Goal: Vascular access site(s) Level 0-1 will be  maintained Outcome: Progressing   Problem: Health Behavior/Discharge Planning: Goal: Ability to safely manage health-related needs after discharge will improve Outcome: Progressing

## 2018-04-12 NOTE — Progress Notes (Signed)
  Echocardiogram 2D Echocardiogram has been performed.  Elizabeth Nicholson 04/12/2018, 12:21 PM

## 2018-04-12 NOTE — Progress Notes (Signed)
CBG 158. Cards fellow text-paged for insulin orders. Pt's home 70/30 novolin not ordered till tomorrow. Awaiting orders.

## 2018-04-12 NOTE — Care Management (Signed)
Brilinta check sent and pending. CM team will follow to discuss est monthly copay amount once cost has been determined.   Romilda Garret. Brixon Zhen BSN, NCM-BC, ACM-RN

## 2018-04-12 NOTE — Care Management (Signed)
Per [facility] [facility] co-pay amount for 30 day supply Brilinta 92m twice a day $47.00 retail. 90 day supply 90 mg.twice a day is $131.00 at mail in pharmacy. Deductible has not been met $50.00. No PA required Pharmacy in network are [facility] Ref.# for this call is [phone]

## 2018-04-12 NOTE — Progress Notes (Signed)
CARDIAC REHAB PHASE I   PRE:  Rate/Rhythm: 81 SR    BP: sitting 113/50    SaO2: 96 RA  MODE:  Ambulation: 190 ft   POST:  Rate/Rhythm: 90 SR    BP: sitting 126/70 manual     SaO2: 98 RA  Pt able to ambulate short distance. She sts that her breathing and back pain have been a chronic problem and she does not walk far. Some SOB walking, sts it is probably normal for her. I suggested trying a RW tomorrow to see if this supports her back more and allows further distance. To recliner. Ed completed with pt and daughter. Good reception. Understands importance of Brilinta. Needs card. Encouraged more frequent short walks at home. Will refer to [facility].  [phone]   Harriet Masson CES, ACSM 04/12/2018 2:20 PM

## 2018-04-13 ENCOUNTER — Other Ambulatory Visit: Payer: Self-pay

## 2018-04-13 LAB — BASIC METABOLIC PANEL
Anion gap: 10 (ref 5–15)
BUN: 37 mg/dL — ABNORMAL HIGH (ref 8–23)
CO2: 23 mmol/L (ref 22–32)
CREATININE: 1.65 mg/dL — AB (ref 0.44–1.00)
Calcium: 9.3 mg/dL (ref 8.9–10.3)
Chloride: 103 mmol/L (ref 98–111)
GFR calc Af Amer: 34 mL/min — ABNORMAL LOW (ref >60)
GFR calc non Af Amer: 30 mL/min — ABNORMAL LOW (ref >60)
Glucose, Bld: 153 mg/dL — ABNORMAL HIGH (ref 70–99)
Potassium: 3.7 mmol/L (ref 3.5–5.1)
Sodium: 136 mmol/L (ref 135–145)

## 2018-04-13 LAB — TROPONIN I: Troponin I: 23.96 ng/mL (ref <0.03)

## 2018-04-13 LAB — GLUCOSE, CAPILLARY: Glucose-Capillary: 229 mg/dL — ABNORMAL HIGH (ref 70–99)

## 2018-04-13 MED ORDER — COLESTIPOL HCL 1 G PO TABS
1.0000 g | ORAL_TABLET | ORAL | Status: DC
  Administered 2018-04-13 – 2018-04-14 (×2): 1 g via ORAL
  Filled 2018-04-13 (×2): qty 1

## 2018-04-13 NOTE — Progress Notes (Signed)
CARDIAC REHAB PHASE I   PRE:  Rate/Rhythm: 78 SR    BP: sitting 98/37    SaO2: 96 RA  MODE:  Ambulation: 370 ft   POST:  Rate/Rhythm: 88 SR    BP: sitting 133/62     SaO2: 94 RA  Pt used RW and tolerated much better. Able to increase distance with less back pain and SOB. She will benefit from a RW for home to increase distance and functional status. Reviewed ex for home. Encouraged more walking this pm. [id number]   Harriet Masson CES, ACSM 04/13/2018 1:41 PM

## 2018-04-13 NOTE — Progress Notes (Signed)
Progress Note  Patient Name: Elizabeth Nicholson Date of Encounter: 04/13/2018  Primary Cardiologist: No primary care provider on file.   Subjective   No chest pain, continues to have shortness of breath with activity.  No orthopnea or edema.  Inpatient Medications    Scheduled Meds: . aspirin EC  81 mg Oral Daily  . furosemide  40 mg Intravenous Once  . furosemide  40 mg Oral Daily  . insulin aspart protamine- aspart  30 Units Subcutaneous Q supper  . levothyroxine  112 mcg Oral QAC breakfast  . metoprolol succinate  75 mg Oral Daily  . potassium chloride SA  20 mEq Oral Daily  . pravastatin  80 mg Oral q1800  . sodium chloride flush  3 mL Intravenous Q12H  . ticagrelor  90 mg Oral BID   Continuous Infusions: . sodium chloride    . nitroGLYCERIN Stopped (04/12/18 0601)   PRN Meds: sodium chloride, acetaminophen, benzonatate, nitroGLYCERIN, ondansetron (ZOFRAN) IV, sodium chloride flush   Vital Signs    Vitals:   04/13/18 0000 04/13/18 0400 04/13/18 0747 04/13/18 0755  BP:   (!) 112/54   Pulse:   70   Resp:   (!) 27   Temp: 98 F (36.7 C) 98.1 F (36.7 C)  (!) 97.5 F (36.4 C)  TempSrc: Oral Oral  Oral  SpO2:   96%   Weight:      Height:        Intake/Output Summary (Last 24 hours) at 04/13/2018 1011 Last data filed at 04/13/2018 0800 Gross per 24 hour  Intake 1140 ml  Output 1400 ml  Net -260 ml   Filed Weights   04/11/18 2049  Weight: 99.3 kg    Telemetry    Normal sinus rhythm without significant arrhythmia- Personally Reviewed   Physical Exam  Alert, oriented woman in no distress GEN: No acute distress.   Neck: No JVD Cardiac: RRR, no murmurs, rubs, or gallops.  Respiratory: Clear to auscultation bilaterally. GI: Soft, nontender, non-distended  MS: No edema; No deformity.  There is a hematoma in the right forearm that appears stable.  There is bruising of the left upper arm related to the blood pressure cuff. Neuro:  Nonfocal  Psych: Normal  affect   Labs    Chemistry Recent Labs  Lab 04/11/18 2102 04/12/18 0832 04/13/18 0422  NA 137 136 136  K 4.2 4.3 3.7  CL 106 103 103  CO2 21* 19* 23  GLUCOSE 183* 147* 153*  BUN 38* 34* 37*  CREATININE 1.76* 1.47* 1.65*  CALCIUM 9.2 9.5 9.3  PROT 7.3  --   --   ALBUMIN 4.0  --   --   AST 22  --   --   ALT 19  --   --   ALKPHOS 69  --   --   BILITOT 0.6  --   --   GFRNONAA 27* 34* 30*  GFRAA 32* 39* 34*  ANIONGAP 10 14 10      Hematology Recent Labs  Lab 04/11/18 2102 04/12/18 0832 04/12/18 2151  WBC 10.5 10.0 10.0  RBC 4.07 4.06 4.11  HGB 12.8 12.3 12.7  HCT 38.5 37.4 38.4  MCV 94.6 92.1 93.4  MCH 31.4 30.3 30.9  MCHC 33.2 32.9 33.1  RDW 12.6 12.9 13.0  PLT 208 219 233    Cardiac Enzymes Recent Labs  Lab 04/12/18 0832 04/12/18 1505 04/12/18 2151  TROPONINI 35.79* 27.38* 23.96*    Recent Labs  Lab  04/11/18 2105  TROPIPOC 0.16*     BNPNo results for input(s): BNP, PROBNP in the last 168 hours.   DDimer No results for input(s): DDIMER in the last 168 hours.   Radiology    Dg Chest Port 1 View  Result Date: 04/11/2018 CLINICAL DATA:  Chest pain. EXAM: PORTABLE CHEST 1 VIEW COMPARISON:  Radiograph May 03, 2012. FINDINGS: Stable cardiomegaly. No pneumothorax or pleural effusion is noted. Right lung is clear. Mild left basilar atelectasis, scarring or infiltrate is noted. Bony thorax is unremarkable. IMPRESSION: Mild left basilar scarring, atelectasis or infiltrate is noted. Electronically Signed   By: Lupita Raider, M.D.   On: 04/11/2018 21:25    Cardiac Studies   2D Echo: Study Conclusions  - Left ventricle: The cavity size was normal. Wall thickness was   increased in a pattern of mild LVH. Systolic function was normal.   The estimated ejection fraction was in the range of 60% to 65%.   Wall motion was normal; there were no regional wall motion   abnormalities. There was an increased relative contribution of   atrial contraction to  ventricular filling. Doppler parameters are   consistent with abnormal left ventricular relaxation (grade 1   diastolic dysfunction). Doppler parameters are consistent with   high ventricular filling pressure. - Aortic valve: Poorly visualized. There was mild regurgitation.   Valve area (VTI): 1.99 cm^2. Valve area (Vmax): 1.89 cm^2. Valve   area (Vmean): 1.76 cm^2. - Mitral valve: Calcified annulus. Moderate diffuse thickening and   calcification of the anterior leaflet. Mobility was mildly   restricted. The findings are consistent with moderate stenosis.   Mean gradient (D): 6 mm Hg. Valve area by continuity equation   (using LVOT flow): 1.05 cm^2. - Left atrium: The atrium was moderately dilated.  Patient Profile     78 y.o. female with history of mitral valve stenosis, mild aortic valve insufficiency, and chronic diastolic heart failure who presented 04/11/2018 with an acute lateral STEMI, treated with primary PCI of the obtuse marginal branch with a drug-eluting stent.  Assessment & Plan    1.  Acute lateral wall STEMI: Patient status post primary PCI.  LV function is completely preserved based on post MI echo with LVEF 65%.  Will continue aspirin, ticagrelor, a statin drug, and metoprolol succinate.  Will hold on ACE/ARB with her acute kidney injury.  She cannot tolerate a high intensity statin drug based on her history of intolerance to rosuvastatin and atorvastatin.  2.  Aortic valve insufficiency/mitral valve stenosis: Echocardiogram reviewed with mild AI and moderate MS, both appropriate for ongoing surveillance and medical therapy.  3.  Acute on chronic diastolic heart failure: Patient treated with oral furosemide yesterday.  Appears euvolemic.  Continue oral furosemide.  Disposition: Transfer to telemetry bed today.  Consider hospital discharge tomorrow.  Ambulate with cardiac rehab today.  For questions or updates, please contact [facility] Please consult [email or web address] for  contact info under        Signed, Tonny Bollman, MD  04/13/2018, 10:11 AM

## 2018-04-13 NOTE — Plan of Care (Signed)
  Problem: Clinical Measurements: Goal: Will remain free from infection Outcome: Progressing Goal: Diagnostic test results will improve Outcome: Progressing Goal: Respiratory complications will improve Outcome: Progressing   Problem: Activity: Goal: Risk for activity intolerance will decrease Outcome: Progressing   Problem: Coping: Goal: Level of anxiety will decrease Outcome: Progressing   

## 2018-04-13 NOTE — Progress Notes (Signed)
Patient transferred from 2 heart to 4 East room 22, patient verified with CCMD x2, CHG bath completed, and 2 RN's verified patient's skin assessment. Vital signs stable. Patient's right radial pulse +2, level 1 hematoma around patient's radial cath site, with bruising on right arm. Petechia covering patient's right hand, patient has normal sensation, color, and movement per the patient. Will continue to monitor and provide care.

## 2018-04-14 ENCOUNTER — Telehealth: Payer: Self-pay | Admitting: Cardiology

## 2018-04-14 LAB — BASIC METABOLIC PANEL
Anion gap: 11 (ref 5–15)
BUN: 47 mg/dL — ABNORMAL HIGH (ref 8–23)
CO2: 22 mmol/L (ref 22–32)
Calcium: 8.9 mg/dL (ref 8.9–10.3)
Chloride: 104 mmol/L (ref 98–111)
Creatinine, Ser: 1.75 mg/dL — ABNORMAL HIGH (ref 0.44–1.00)
GFR calc Af Amer: 32 mL/min — ABNORMAL LOW (ref >60)
GFR calc non Af Amer: 28 mL/min — ABNORMAL LOW (ref >60)
Glucose, Bld: 159 mg/dL — ABNORMAL HIGH (ref 70–99)
Potassium: 3.6 mmol/L (ref 3.5–5.1)
Sodium: 137 mmol/L (ref 135–145)

## 2018-04-14 MED ORDER — ASPIRIN 81 MG PO TBEC: 81.0000 mg | DELAYED_RELEASE_TABLET | Freq: Every day | ORAL | 3 refills | Status: AC

## 2018-04-14 MED ORDER — METOPROLOL SUCCINATE ER 50 MG PO TB24: 75.0000 mg | ORAL_TABLET | Freq: Every day | ORAL | 6 refills | Status: AC

## 2018-04-14 MED ORDER — NITROGLYCERIN 0.4 MG SL SUBL: 0.4000 mg | SUBLINGUAL_TABLET | SUBLINGUAL | 12 refills | Status: AC | PRN

## 2018-04-14 MED ORDER — PRAVASTATIN SODIUM 80 MG PO TABS: 80.0000 mg | ORAL_TABLET | Freq: Every day | ORAL | 11 refills | Status: DC

## 2018-04-14 MED ORDER — TICAGRELOR 90 MG PO TABS: 90.0000 mg | ORAL_TABLET | Freq: Two times a day (BID) | ORAL | 11 refills | Status: DC

## 2018-04-14 MED FILL — METOPROLOL SUCCINATE ER 50: 50 | 30 days supply | Qty: 60 | Fill #0 | Status: TO

## 2018-04-14 MED FILL — ASPIRIN LOW DOSE 81 MG TBEC: 81 | 90 days supply | Qty: 90 | Fill #0 | Status: TO

## 2018-04-14 MED FILL — PRAVASTATIN NA 40 MG TAB: 40 | 30 days supply | Qty: 60 | Fill #0 | Status: TO

## 2018-04-14 MED FILL — BRILINTA 90 MG TABLET: 90 | 30 days supply | Qty: 60 | Fill #0 | Status: TO

## 2018-04-14 MED FILL — NITROGLYCERIN 0.4 MG TAB SL: 0.4 | 8 days supply | Qty: 25 | Fill #0 | Status: TO

## 2018-04-14 NOTE — Progress Notes (Signed)
CARDIAC REHAB PHASE I   Reinforced d/c ed with pt. Pt denies any questions or concerns at this time. Offered to walk with pt, lunch had just arrived. For d/c today. Referred to [facility] [place].  [id number] Reynold Bowen, RN BSN 04/14/2018 11:56 AM

## 2018-04-14 NOTE — Telephone Encounter (Signed)
[place]   Patient has a TOC appt scheduled 04/21/2018 with Nada Boozer.

## 2018-04-14 NOTE — Care Management Note (Signed)
Case Management Note  Patient Details  Name: MONQUE ZAHER MRN: [id number] Date of Birth: 1940/09/20  Subjective/Objective:                    Action/Plan:  Spoke with patient at bedside. She agrees with cardio rehab that a RW would benefit her at home. CM placed order and made referral to [facility] to have it delivered to room prior to DC. CM spoke w pharmacy to have [facility] pharmacy fill her Brilinta and deliver it to room prior to DC.  No other  CM needs.  Expected Discharge Date:                  Expected Discharge Plan:  Home/Self Care  In-House Referral:     Discharge planning Services  CM Consult  Post Acute Care Choice:  Durable Medical Equipment Choice offered to:     DME Arranged:  [facility] DME Agency:  [facility]  HH Arranged:    [facility] Agency:     Status of Service:  Completed, signed off  If discussed at Long Length of Stay Meetings, dates discussed:    Additional Comments:  Lawerance Sabal, RN 04/14/2018, 10:58 AM

## 2018-04-14 NOTE — Discharge Instructions (Signed)

## 2018-04-14 NOTE — Discharge Summary (Addendum)
Discharge Summary    Patient ID: Elizabeth Nicholson MRN: [id number]; DOB: [birth year]  Admit date: 04/11/2018 Discharge date: 04/14/2018  Primary Care Provider: Carol Ada, MD  Primary Cardiologist: Sinclair Grooms, MD   Discharge Diagnoses    Principal Problem:   Non-ST elevation (NSTEMI) myocardial infarction [facility]) Active Problems:   Mitral stenosis   Chronic diastolic heart failure ([place])   Hypertension, essential, benign   Diabetes mellitus ([place])   Hyperlipidemia   NSTEMI (non-ST elevated myocardial infarction) ([place])   Allergies Allergies  Allergen Reactions  . Benazepril Cough  . Metformin And Related Diarrhea  . Other Other (See Comments)    Steri Strips. REACTION:  Blisters.  . Sulfa Antibiotics Other (See Comments)    Topical sulfa only.   REACTION:  unknown  . Zoloft [Sertraline Hcl] Other (See Comments)    REACTION:  Negative mindset    Diagnostic Studies/Procedures  2D Echo: 04/12/18 Study Conclusions  - Left ventricle: The cavity size was normal. Wall thickness was increased in a pattern of mild LVH. Systolic function was normal. The estimated ejection fraction was in the range of 60% to 65%. Wall motion was normal; there were no regional wall motion abnormalities. There was an increased relative contribution of atrial contraction to ventricular filling. Doppler parameters are consistent with abnormal left ventricular relaxation (grade 1 diastolic dysfunction). Doppler parameters are consistent with high ventricular filling pressure. - Aortic valve: Poorly visualized. There was mild regurgitation. Valve area (VTI): 1.99 cm^2. Valve area (Vmax): 1.89 cm^2. Valve area (Vmean): 1.76 cm^2. - Mitral valve: Calcified annulus. Moderate diffuse thickening and calcification of the anterior leaflet. Mobility was mildly restricted. The findings are consistent with moderate stenosis. Mean gradient (D): 6 mm Hg. Valve area by continuity  equation (using LVOT flow): 1.05 cm^2. - Left atrium: The atrium was moderately dilated.  CORONARY STENT INTERVENTION  04/11/2018  LEFT HEART CATH AND CORONARY ANGIOGRAPHY  Conclusion     Culprit lesion: Ost 1st Mrg lesion is 100% stenosed.  A drug-eluting stent was successfully placed using a STENT SYNERGY DES 2.5X16. -Postdilated to 2.8 mm  Post intervention, there is a 0% residual stenosis. Lat 1st Mrg lesion is 100% stenosed -likely distal embolization..  LV end diastolic pressure is moderately elevated.  Prox LAD lesion is 25% stenosed.   SUMMARY  Severe single-vessel disease with 100% occluded major OM1  branch --> consistent with subtle inferolateral ST elevations that did not meet STEMI criteria.  Successful DES PCI of OM1 -Synergy DES 2.5 mm x 16 mm (2.8 mm)  Moderately elevated LVEDP  RECOMMENDATIONS  Admit to CVICU for ongoing care post PCI  Continue Aggrastat infusion for 6 hours post PCI  Check 2D echocardiogram tomorrow  We will hold ARB and spironolactone given mild increased creatinine  Continue home oral Lasix dose, but may need IV dose tomorrow morning  Continue beta-blocker dose, restart ARB once renal function stabilized     History of Present Illness     Elizabeth Nicholson is a 78 y.o. female with history of moderate to severe mitral stenosis, AI, HTN, HLD and DM who presented with acute CP.  Pt noticed the onset of substernal CP accompanied by nausea while at rest after having dinner.  The chest pressure escalated in intensity until it was 10/10 severity. She was promptly brought to the [facility] ED by EMS.  She recieved full dose ASA and 2 SL nitroglycerin while en route, with improvement in her CP to 2/10.  In the  ED, her ECG showed submillilmeter ST elevation in V5/V6, which did not meet STEMI criteria.  Due to ongoing CP and positive POC troponin (0.16), the cath lab was activated for urgent catheterization.  Hospital Course     Consultants: None  1.   Acute lateral wall NonSTEMI: - Cath showed severe single-vessel disease with 100% occluded major OM1  branch --> consistent with subtle inferolateral ST elevations that did not meet STEMI criteria. S/p uccessful DES PCI of OM1 -Synergy DES 2.5 mm x 16 mm (2.8 mm). Lat 1st Mrg lesion is 100% stenosed -likely distal embolization  LV function is completely preserved based on post MI echo with LVEF 65%. Continue aspirin, ticagrelor, statin, and metoprolol succinate.  Mild dyspnea after taking brillinta, which is improving since admit. Advised to try with Caffeine.   2.  Aortic valve insufficiency/mitral valve stenosis:  - Echocardiogram  with mild AI and moderate MS, both appropriate for ongoing surveillance and medical therapy.  3.  Acute on chronic diastolic heart failure:  - Elevated LVEDP at 36m HG. Continued home dose of lasix. Net I & O -1.7L. Not on daily weight.  - Appears euvolemic.  - Continue BB. Home spironolactone and Avapro held to rising renal function.   4. Acute on CKD III - Baseline creatinine around 1.3. Scr stable her around 1.76>>1.47>>1.65>>1.75. Check BEMT at follow up. Medications recommendations as above.   5. HLD - No results found for requested labs within last 8760 hours. Noted at day of discharge that she does not have baseline LDL check this admission. She is agree to try pravastatin. Consider PCSK9 inhibitor if unable to tolerate.  - She cannot tolerate a high intensity statin drug based on her history of intolerance to rosuvastatin and atorvastatin.  6. DM - Resume home meds   Discharge Vitals Blood pressure 133/60, pulse 69, temperature (!) 97.5 F (36.4 C), temperature source Axillary, resp. rate 15, height '5\' 6"'  (1.676 m), weight 99.3 kg, SpO2 97 %.  Filed Weights   04/11/18 2049  Weight: 99.3 kg   Physical Exam  Constitutional: She is oriented to person, place, and time and well-developed, well-nourished, and in no distress.  HENT:  Head:  Normocephalic and atraumatic.  Eyes: Pupils are equal, round, and reactive to light. EOM are normal.  Neck: Normal range of motion. Neck supple.  Cardiovascular: Normal rate and regular rhythm.  Right radial cath site with improving echymosis  Pulmonary/Chest: Effort normal and breath sounds normal.  Faint rales at base  Abdominal: Soft. Bowel sounds are normal.  Neurological: She is alert and oriented to person, place, and time.  Skin: Skin is warm and dry.  Psychiatric: Affect normal.   Labs & Radiologic Studies    CBC Recent Labs    04/11/18 2102 04/12/18 0832 04/12/18 2151  WBC 10.5 10.0 10.0  NEUTROABS 8.6*  --   --   HGB 12.8 12.3 12.7  HCT 38.5 37.4 38.4  MCV 94.6 92.1 93.4  PLT 208 219 2829  Basic Metabolic Panel Recent Labs    04/13/18 0422 04/14/18 0248  NA 136 137  K 3.7 3.6  CL 103 104  CO2 23 22  GLUCOSE 153* 159*  BUN 37* 47*  CREATININE 1.65* 1.75*  CALCIUM 9.3 8.9   Liver Function Tests Recent Labs    04/11/18 2102  AST 22  ALT 19  ALKPHOS 69  BILITOT 0.6  PROT 7.3  ALBUMIN 4.0   No results for input(s): LIPASE, AMYLASE in the  last 72 hours. Cardiac Enzymes Recent Labs    04/12/18 0832 04/12/18 1505 04/12/18 2151  TROPONINI 35.79* 27.38* 23.96*   BNP Invalid input(s): POCBNP D-Dimer No results for input(s): DDIMER in the last 72 hours. Hemoglobin A1C No results for input(s): HGBA1C in the last 72 hours. Fasting Lipid Panel No results for input(s): CHOL, HDL, LDLCALC, TRIG, CHOLHDL, LDLDIRECT in the last 72 hours. Thyroid Function Tests No results for input(s): TSH, T4TOTAL, T3FREE, THYROIDAB in the last 72 hours.  Invalid input(s): FREET3 _____________  Dg Chest Port 1 View  Result Date: 04/11/2018 CLINICAL DATA:  Chest pain. EXAM: PORTABLE CHEST 1 VIEW COMPARISON:  Radiograph May 03, 2012. FINDINGS: Stable cardiomegaly. No pneumothorax or pleural effusion is noted. Right lung is clear. Mild left basilar atelectasis,  scarring or infiltrate is noted. Bony thorax is unremarkable. IMPRESSION: Mild left basilar scarring, atelectasis or infiltrate is noted. Electronically Signed   By: Marijo Conception, M.D.   On: 04/11/2018 21:25   Disposition   Pt is being discharged home today in good condition.  Follow-up [place] Follow up.   Why:  RW to be delivered to room prior to DC.   Contact information: [address] [place] [address] [phone]        Belva Crome, MD Follow up.   Specialty:  Cardiology Why:  office will call with time and date of appointment, call office if did not heard by Monday  Contact information: [address] [place] [address] [phone]          Discharge Instructions    Amb Referral to Cardiac Rehabilitation   Complete by:  As directed    Diagnosis:   Coronary Stents STEMI PTCA     Diet - low sodium heart healthy   Complete by:  As directed    Discharge instructions   Complete by:  As directed    No driving for 2 weeks. No lifting over 10 lbs for 4 weeks. No sexual activity for 4 weeks. You may not return to work until cleared by your cardiologist.  Keep procedure site clean & dry. If you notice increased pain, swelling, bleeding or pus, call/return!  You may shower, but no soaking baths/hot tubs/pools for 1 week.   Increase activity slowly   Complete by:  As directed       Discharge Medications   Allergies as of 04/14/2018      Reactions   Benazepril Cough   Metformin And Related Diarrhea   Other Other (See Comments)   Steri Strips. REACTION:  Blisters.   Sulfa Antibiotics Other (See Comments)   Topical sulfa only.   REACTION:  unknown   Zoloft [sertraline Hcl] Other (See Comments)   REACTION:  Negative mindset      Medication List    STOP taking these medications   irbesartan 150 MG tablet Commonly known as:  AVAPRO   spironolactone 25 MG  tablet Commonly known as:  ALDACTONE     TAKE these medications   ACCU-CHEK AVIVA PLUS w/Device Kit   ACCU-CHEK FASTCLIX LANCET Kit   aspirin 81 MG EC tablet Commonly known as:  ST JOSEPH ASPIRIN Take 1 tablet (81 mg total) by mouth daily. Swallow whole.   colestipol 1 g tablet Commonly known as:  COLESTID Take 1-2 g by mouth every 12 (twelve) hours as needed (cholesterol).   furosemide 40  MG tablet Commonly known as:  LASIX Take 1 tablet (40 mg total) by mouth daily.   insulin NPH-regular Human (70-30) 100 UNIT/ML injection Inject 50 Units into the skin 2 (two) times daily with a meal.   levothyroxine 112 MCG tablet Commonly known as:  SYNTHROID, LEVOTHROID Take 112 mcg by mouth daily.   metoprolol succinate 50 MG 24 hr tablet Commonly known as:  TOPROL-XL Take 2 tablets (100 mg total) by mouth daily. What changed:    medication strength  how much to take   nitroGLYCERIN 0.4 MG SL tablet Commonly known as:  NITROSTAT Place 1 tablet (0.4 mg total) under the tongue every 5 (five) minutes as needed for chest pain.   potassium chloride SA 20 MEQ tablet Commonly known as:  K-DUR,KLOR-CON Take 1 tablet (20 mEq total) by mouth daily.   pravastatin 80 MG tablet Commonly known as:  PRAVACHOL Take 1 tablet (80 mg total) by mouth daily at 6 PM.   ticagrelor 90 MG Tabs tablet Commonly known as:  BRILINTA Take 1 tablet (90 mg total) by mouth 2 (two) times daily.            Durable Medical Equipment  (From admission, onward)         Start     Ordered   04/14/18 1055  For home use only DME Walker rolling  Once    Question:  Patient needs a walker to treat with the following condition  Answer:  Weakness   04/14/18 1054           Acute coronary syndrome (MI, NSTEMI, STEMI, etc) this admission?: Yes.     AHA/ACC Clinical Performance & Quality Measures: 1. Aspirin prescribed? - Yes 2. ADP Receptor Inhibitor (Plavix/Clopidogrel, Brilinta/Ticagrelor or  Effient/Prasugrel) prescribed (includes medically managed patients)? - Yes 3. Beta Blocker prescribed? - Yes 4. High Intensity Statin (Lipitor 40-102m or Crestor 20-468m prescribed? - No - on Pravastatine due to MaGridleyn other drugs 5. EF assessed during THIS hospitalization? - Yes 6. For EF <40%, was ACEI/ARB prescribed? - No - Reason:  held due to AKI 7. For EF <40%, Aldosterone Antagonist (Spironolactone or Eplerenone) prescribed? - Not Applicable (EF >/= 4081%8. Cardiac Rehab Phase II ordered (Included Medically managed Patients)? - Yes     Outstanding Labs/Studies   Consider OP f/u labs 6-8 weeks given statin initiation this admission. BMET at follow up   Duration of Discharge Encounter   Greater than 30 minutes including physician time.  SiJarrett SohoPA 04/14/2018, 11:47 AM  Patient seen, examined. Available data reviewed. Agree with findings, assessment, and plan as outlined by ViRobbie LisPA.  On my exam the patient is alert, oriented, in no distress.  Lungs are clear, heart is regular rate and rhythm with no murmur gallop, JVP is normal, abdomen is soft nontender, extremities show no edema.  The patient has progressed well after her inferior wall MI.  She is experiencing intermittent mild shortness of breath related to ticagrelor.  She feels she can tolerate this without too much trouble.  She will continue on her current medical program which I reviewed today.  She does not want to take a high intensity statin drug because of previous problems with these.  She will remain on pravastatin.  We will arrange for post hospital follow-up.  MiSherren MochaM.D. 04/14/2018 1:29 PM

## 2018-04-14 NOTE — Progress Notes (Signed)
AVS put in an envelope given to Pt, all discharge instructions, medical prescriptions and self care education given to Pt. All questions were answered, Pt had fully understanding and had no further questions at this time.. All of her belongings given back, all of home meds from the main pharmacy given back to Pt with co signed . Pt was left 4 east unit at 02:25 pm via wheelchair and a staff from volunteer accompany.  Filiberto Pinks, BSN, RN,PCCN-CMC

## 2018-04-14 NOTE — Care Management Important Message (Signed)
Important Message  Patient Details  Name: Elizabeth Nicholson MRN: [id number] Date of Birth: 09/04/1940   Medicare Important Message Given:  Yes    Cory Kitt P Hawthorne Day 04/14/2018, 4:30 PM

## 2018-04-15 NOTE — Telephone Encounter (Signed)
Patient contacted regarding discharge from [facility] on 04/14/2018.  Patient understands to follow up with provider Nada Boozer NP on 04/21/2018 at 12 noon at [address] [facility]. Patient understands discharge instructions? yes Patient understands medications and regiment? yes Patient understands to bring all medications to this visit? yes

## 2018-04-15 NOTE — Telephone Encounter (Signed)
LMTCB for her TOC call

## 2018-04-15 NOTE — Telephone Encounter (Signed)
Pt wanted to have an appt with Dr. Katrinka Blazing after her [facility] visit with Nada Boozer NP... appt made per her request for 06/17/18 but advised her that when she sees Vernona Rieger she may determine the appt should be sooner or later and it may be changed.. pt verbalized understanding and agreed.

## 2018-04-19 ENCOUNTER — Telehealth (HOSPITAL_COMMUNITY): Payer: Self-pay

## 2018-04-19 NOTE — Telephone Encounter (Signed)
Called patient to see if she is interested in the Cardiac Rehab Program. Patient expressed interest. Explained scheduling process and went over insurance, patient verbalized understanding. Will contact patient for scheduling once f/u has been completed. 

## 2018-04-19 NOTE — Telephone Encounter (Signed)
Pt insurance is active and benefits verified through [facility] Medicare. Co-pay $20.00, DED $0.00/$0.00 met, out of pocket $3,600.00/$0.00 met, co-insurance 0%. No pre-authorization required. Passport, 04/19/2018 @ 1212PM, REF# [id number]  Will contact patient to see if she is interested in the Cardiac Rehab Program. If interested, patient will need to complete follow up appt. Once completed, patient will be contacted for scheduling upon review by the RN Navigator.

## 2018-04-19 NOTE — Progress Notes (Signed)
Transitions of Care Follow Up Call Note  Elizabeth Nicholson is an 78 y.o. female who presented to [facility] on 04/11/2018.  The patient had the following prescriptions [facility] at [facility]: metoprolol, pravastatin, nitroglycerin, Brilinta, aspirin.  Patient was called by pharmacist and HIPAA identifiers were verified. The following questions were asked about the prescriptions filled at [facility]:  Has the patient been experiencing any side effects to the medications prescribed? Yes, some dyspnea from the Brilinta. Experienced it while in hospital. Patient advised to take with small amount of caffeine. Patient also advised to contact cardiologist with any changing or worsening symptoms.  Understanding of regimen: good Understanding of indications: good Potential of compliance: good  Pharmacist comments: no concerns at this time.    [x]  Patient's prescriptions [facility] at [facility] were transferred to the following pharmacy: [facility]  []  Patient unable to be reached after calling three times and prescriptions filled at the [facility] were transferred to preferred pharmacy found within their chart.   Elizabeth Nicholson K Burns Timson 04/19/2018, 5:46 PM Transitions of Care Pharmacy Hours: Monday - Friday 8:30am to 5:00 PM  Phone - [phone]

## 2018-04-20 ENCOUNTER — Encounter: Payer: Self-pay | Admitting: Cardiology
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
## 2018-04-21 ENCOUNTER — Ambulatory Visit: Payer: Medicare Other | Admitting: Cardiology

## 2018-04-21 ENCOUNTER — Telehealth: Payer: Self-pay | Admitting: *Deleted

## 2018-04-21 VITALS — BP 136/68 | HR 62 | Ht 66.0 in | Wt 216.0 lb

## 2018-04-21 DIAGNOSIS — N289 Disorder of kidney and ureter, unspecified: Secondary | ICD-10-CM | POA: Diagnosis not present

## 2018-04-21 DIAGNOSIS — E1159 Type 2 diabetes mellitus with other circulatory complications: Secondary | ICD-10-CM

## 2018-04-21 DIAGNOSIS — R61 Generalized hyperhidrosis: Secondary | ICD-10-CM

## 2018-04-21 DIAGNOSIS — I10 Essential (primary) hypertension: Secondary | ICD-10-CM

## 2018-04-21 DIAGNOSIS — I2510 Atherosclerotic heart disease of native coronary artery without angina pectoris: Secondary | ICD-10-CM

## 2018-04-21 DIAGNOSIS — E032 Hypothyroidism due to medicaments and other exogenous substances: Secondary | ICD-10-CM | POA: Diagnosis not present

## 2018-04-21 DIAGNOSIS — Z95820 Peripheral vascular angioplasty status with implants and grafts: Secondary | ICD-10-CM | POA: Diagnosis not present

## 2018-04-21 NOTE — Telephone Encounter (Signed)
Left message to call back  

## 2018-04-21 NOTE — Telephone Encounter (Signed)
-----   Message from [name], NP sent at 04/21/2018  1:06 PM EST ----- Could you move up Ms. Elizabeth Nicholson follow up any to see Dr. Katrinka Blazing.  She had MI and is stable but would like her seen end of Feb.   She had MI and stent.

## 2018-04-21 NOTE — Patient Instructions (Addendum)
Medication Instructions:    HOLD AVEPRO (IRBESARTAN)  UNTIL FURTHER NOTICE   STARTING TOPROL 100 MG IN THE AM ( 2 50 MG TABLETS )   If you need a refill on your cardiac medications before your next appointment, please call your pharmacy.   Lab work: BMP CBC AND TSH  TODAY    If you have labs (blood work) drawn today and your tests are completely normal, you will receive your results only by: Marland Kitchen MyChart Message (if you have MyChart) OR . A paper copy in the mail If you have any lab test that is abnormal or we need to change your treatment, we will call you to review the results.  Testing/Procedures: NONE ORDERED  TODAY    Follow-Up: A SCHEDULED     Any Other Special Instructions Will Be Listed Below (If Applicable).

## 2018-04-22 ENCOUNTER — Telehealth: Payer: Self-pay | Admitting: *Deleted

## 2018-04-22 ENCOUNTER — Encounter: Payer: Self-pay | Admitting: Cardiology

## 2018-04-22 LAB — CBC
Hematocrit: 36.2 % (ref 34.0–46.6)
Hemoglobin: 12.5 g/dL (ref 11.1–15.9)
MCH: 31.4 pg (ref 26.6–33.0)
MCHC: 34.5 g/dL (ref 31.5–35.7)
MCV: 91 fL (ref 79–97)
Platelets: 300 10*3/uL (ref 150–450)
RBC: 3.98 x10E6/uL (ref 3.77–5.28)
RDW: 13 % (ref 11.7–15.4)
WBC: 9.8 10*3/uL (ref 3.4–10.8)

## 2018-04-22 LAB — TSH: TSH: 1.53 u[IU]/mL (ref 0.450–4.500)

## 2018-04-22 NOTE — Telephone Encounter (Signed)
Called pt re: lab results, left a message for pt to call back.  

## 2018-04-22 NOTE — Telephone Encounter (Signed)
-----   Message from [name], NP sent at 04/22/2018  1:50 PM EST ----- Labs stable

## 2018-04-22 NOTE — Telephone Encounter (Signed)
Returned pts call and she has been made aware of her lab results. Copy will be mailed to the pt.

## 2018-04-22 NOTE — Telephone Encounter (Signed)
Follow up ° °Patient is returning call for lab results. °

## 2018-04-22 NOTE — Telephone Encounter (Signed)
Spoke with pt and scheduled her to see Dr. Katrinka Blazing 2/25.  Pt in agreement with this date.

## 2018-04-26 ENCOUNTER — Other Ambulatory Visit: Payer: Self-pay

## 2018-04-26 ENCOUNTER — Telehealth: Payer: Self-pay

## 2018-04-26 MED ORDER — PRAVASTATIN SODIUM 80 MG PO TABS: 80.0000 mg | ORAL_TABLET | Freq: Every day | ORAL | 3 refills | Status: AC

## 2018-04-26 NOTE — Telephone Encounter (Signed)
Refill sent in

## 2018-04-27 ENCOUNTER — Telehealth: Payer: Self-pay

## 2018-04-27 ENCOUNTER — Telehealth (HOSPITAL_COMMUNITY): Payer: Self-pay

## 2018-04-27 DIAGNOSIS — Z79899 Other long term (current) drug therapy: Secondary | ICD-10-CM

## 2018-04-27 DIAGNOSIS — I10 Essential (primary) hypertension: Secondary | ICD-10-CM

## 2018-04-27 DIAGNOSIS — N289 Disorder of kidney and ureter, unspecified: Secondary | ICD-10-CM

## 2018-04-27 DIAGNOSIS — E876 Hypokalemia: Secondary | ICD-10-CM

## 2018-04-27 DIAGNOSIS — I5032 Chronic diastolic (congestive) heart failure: Secondary | ICD-10-CM

## 2018-04-27 LAB — BASIC METABOLIC PANEL
BUN/Creatinine Ratio: 18 (ref 12–28)
BUN: 25 mg/dL (ref 8–27)
CO2: 16 mmol/L — ABNORMAL LOW (ref 20–29)
Calcium: 9.4 mg/dL (ref 8.7–10.3)
Chloride: 101 mmol/L (ref 96–106)
Creatinine, Ser: 1.42 mg/dL — ABNORMAL HIGH (ref 0.57–1.00)
GFR calc Af Amer: 41 mL/min/{1.73_m2} — ABNORMAL LOW (ref >59)
GFR calc non Af Amer: 36 mL/min/{1.73_m2} — ABNORMAL LOW (ref >59)
Glucose: 162 mg/dL — ABNORMAL HIGH (ref 65–99)
Potassium: 4.2 mmol/L (ref 3.5–5.2)
Sodium: 138 mmol/L (ref 134–144)

## 2018-04-27 MED ORDER — IRBESARTAN 150 MG PO TABS: 150.0000 mg | ORAL_TABLET | Freq: Every day | ORAL | 3 refills | Status: AC

## 2018-04-27 NOTE — Telephone Encounter (Signed)
Called patient to see if she was interested in participating in the Cardiac Rehab Program. Patient stated yes. Patient will come in for orientation on 06/02/2018 @ 130PM and will attend the 1:15PM exercise class.  Mailed homework package.

## 2018-04-27 NOTE — Telephone Encounter (Signed)
-----   Message from Elliot Cousin, [place] sent at 04/27/2018  1:56 PM EST ----- I AM ASSISTING DR Eden Emms TODAY AND WILL NOT HAVE TIME TO CALL PT.  PLEASE CALL. THANKS!

## 2018-04-27 NOTE — Telephone Encounter (Signed)
Pt advised lab results and will return 05/04/18 for repeat labs..Marland Kitchen

## 2018-05-03 ENCOUNTER — Telehealth: Payer: Self-pay | Admitting: Interventional Cardiology

## 2018-05-03 MED ORDER — CLOPIDOGREL BISULFATE 75 MG PO TABS: ORAL_TABLET | ORAL | 3 refills | Status: AC

## 2018-05-03 NOTE — Telephone Encounter (Signed)
Highly likely related to Brilinta.  Can she tolerate this until I see her next or do we need to make a change?  If a change is needed, give 300 mg of Plavix with the final dose of Brilinta then Plavix 75 mg/day thereafter

## 2018-05-03 NOTE — Telephone Encounter (Signed)
Spoke with pt and she states that her SOB has worsened since she seen Nada Boozer, NP.  States it comes and goes regardless of what she is doing.  Vernona Rieger mentioned in her note may be related to Brilinta.  Advised I will send to Dr. Katrinka Blazing to review.

## 2018-05-03 NOTE — Telephone Encounter (Signed)
Spoke with pt and went over recommendations per Dr. Katrinka Blazing.  Pt verbalized understanding and was in agreement with this plan.  Prescription sent to [facility].

## 2018-05-03 NOTE — Telephone Encounter (Signed)
Please see the note below.

## 2018-05-03 NOTE — Telephone Encounter (Signed)
New message   Pt c/o medication issue:  1. Name of Medication:Brilinta  2. How are you currently taking this medication (dosage and times per day)? Twice daily  3. Are you having a reaction (difficulty breathing--STAT)? No   4. What is your medication issue? Patient states that she is having sob. Patient states that she thinks this a side effect. Please advise.

## 2018-05-04 ENCOUNTER — Telehealth: Payer: Self-pay | Admitting: Interventional Cardiology

## 2018-05-04 ENCOUNTER — Other Ambulatory Visit: Payer: Medicare Other | Admitting: *Deleted

## 2018-05-04 DIAGNOSIS — I5032 Chronic diastolic (congestive) heart failure: Secondary | ICD-10-CM

## 2018-05-04 DIAGNOSIS — N289 Disorder of kidney and ureter, unspecified: Secondary | ICD-10-CM

## 2018-05-04 DIAGNOSIS — Z79899 Other long term (current) drug therapy: Secondary | ICD-10-CM

## 2018-05-04 DIAGNOSIS — E876 Hypokalemia: Secondary | ICD-10-CM

## 2018-05-04 DIAGNOSIS — I10 Essential (primary) hypertension: Secondary | ICD-10-CM

## 2018-05-04 NOTE — Telephone Encounter (Signed)
Pt wanted to make sure she understood the instructions given to her yesterday. Re-interated: give 300 mg of Plavix with the final dose of Brilinta then Plavix 75 mg/day thereafter Patient verbalized understanding and agreeable to plan.

## 2018-05-04 NOTE — Telephone Encounter (Signed)
New message   Pt c/o medication issue:  1. Name of Medication: clopidogrel (PLAVIX) 75 MG tablet  2. How are you currently taking this medication (dosage and times per day)?n/a  3. Are you having a reaction (difficulty breathing--STAT)?n/a  4. What is your medication issue? Patient has question about how to take this medication along with brilinta.

## 2018-05-05 LAB — BASIC METABOLIC PANEL
BUN/Creatinine Ratio: 17 (ref 12–28)
BUN: 22 mg/dL (ref 8–27)
CO2: 21 mmol/L (ref 20–29)
Calcium: 9 mg/dL (ref 8.7–10.3)
Chloride: 102 mmol/L (ref 96–106)
Creatinine, Ser: 1.26 mg/dL — ABNORMAL HIGH (ref 0.57–1.00)
GFR calc non Af Amer: 41 mL/min/{1.73_m2} — ABNORMAL LOW (ref >59)
GFR, EST AFRICAN AMERICAN: 47 mL/min/{1.73_m2} — AB (ref >59)
Glucose: 152 mg/dL — ABNORMAL HIGH (ref 65–99)
Potassium: 4 mmol/L (ref 3.5–5.2)
Sodium: 139 mmol/L (ref 134–144)

## 2018-05-06 ENCOUNTER — Emergency Department (HOSPITAL_COMMUNITY)
Admission: EM | Admit: 2018-05-06 | Discharge: 2018-06-05 | Disposition: E | Payer: Medicare Other | Attending: Emergency Medicine | Admitting: Emergency Medicine

## 2018-05-06 DIAGNOSIS — Z7902 Long term (current) use of antithrombotics/antiplatelets: Secondary | ICD-10-CM | POA: Diagnosis not present

## 2018-05-06 DIAGNOSIS — E039 Hypothyroidism, unspecified: Secondary | ICD-10-CM | POA: Diagnosis not present

## 2018-05-06 DIAGNOSIS — Z7982 Long term (current) use of aspirin: Secondary | ICD-10-CM | POA: Insufficient documentation

## 2018-05-06 DIAGNOSIS — I5032 Chronic diastolic (congestive) heart failure: Secondary | ICD-10-CM | POA: Diagnosis not present

## 2018-05-06 DIAGNOSIS — Z79899 Other long term (current) drug therapy: Secondary | ICD-10-CM | POA: Diagnosis not present

## 2018-05-06 DIAGNOSIS — E119 Type 2 diabetes mellitus without complications: Secondary | ICD-10-CM | POA: Insufficient documentation

## 2018-05-06 DIAGNOSIS — I110 Hypertensive heart disease with heart failure: Secondary | ICD-10-CM | POA: Insufficient documentation

## 2018-05-06 DIAGNOSIS — Z794 Long term (current) use of insulin: Secondary | ICD-10-CM | POA: Insufficient documentation
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
## 2018-05-07 DIAGNOSIS — 419620001 Death: Secondary | SNOMED CT | POA: Diagnosis present

## 2018-05-07 DEATH — deceased

## 2018-05-13 ENCOUNTER — Telehealth: Payer: Self-pay | Admitting: Interventional Cardiology

## 2018-05-13 NOTE — Telephone Encounter (Signed)
New Message:     Luster Landsberg pt's daughter would like for Dr Katrinka Blazing to give her a call today after he finish seeing patients please. Pt passed away last Friday(05-19-2018).

## 2018-05-31 ENCOUNTER — Ambulatory Visit: Payer: Medicare Other | Admitting: Interventional Cardiology

## 2018-06-02 ENCOUNTER — Ambulatory Visit (HOSPITAL_COMMUNITY): Payer: Medicare Other

## 2018-06-05 NOTE — Progress Notes (Signed)
   15-May-2018 0000  Clinical Encounter Type  Visited With Health care provider;Family  Visit Type Initial;Follow-up;Psychological support;Code;Death;ED  Referral From Other (Comment) (ED, post cpr)  Spiritual Encounters  Spiritual Needs Grief support;Emotional;Brochure  Stress Factors  Family Stress Factors Loss of control;Loss;Major life changes;Family relationships;Exhausted   Requested by ED for post-CPR death.  Met w/ family, present when dr informed of death.  Walked family bk when pt ready to see.  Coordinated w/ care RN Freida Busman, family unsure that pt was organ donor but does want to pursue that option.  Gave next of kin daughter contact info to RN.  Pt's 53 yo great-granddaughter was present, gave info to her mother about [facility] and guidance for talking to kids about death.  Myra Gianotti resident, [phone]

## 2018-06-05 DEATH — deceased

## 2018-06-06 ENCOUNTER — Ambulatory Visit (HOSPITAL_COMMUNITY): Payer: Medicare Other

## 2018-06-08 ENCOUNTER — Ambulatory Visit (HOSPITAL_COMMUNITY): Payer: Medicare Other

## 2018-06-10 ENCOUNTER — Ambulatory Visit (HOSPITAL_COMMUNITY): Payer: Medicare Other

## 2018-06-13 ENCOUNTER — Ambulatory Visit (HOSPITAL_COMMUNITY): Payer: Medicare Other

## 2018-06-15 ENCOUNTER — Ambulatory Visit (HOSPITAL_COMMUNITY): Payer: Medicare Other

## 2018-06-17 ENCOUNTER — Ambulatory Visit: Payer: Medicare Other | Admitting: Interventional Cardiology

## 2018-06-17 ENCOUNTER — Ambulatory Visit (HOSPITAL_COMMUNITY): Payer: Medicare Other

## 2018-06-20 ENCOUNTER — Ambulatory Visit (HOSPITAL_COMMUNITY): Payer: Medicare Other

## 2018-06-22 ENCOUNTER — Ambulatory Visit (HOSPITAL_COMMUNITY): Payer: Medicare Other

## 2018-06-24 ENCOUNTER — Ambulatory Visit (HOSPITAL_COMMUNITY): Payer: Medicare Other

## 2018-06-27 ENCOUNTER — Ambulatory Visit (HOSPITAL_COMMUNITY): Payer: Medicare Other

## 2018-06-29 ENCOUNTER — Ambulatory Visit (HOSPITAL_COMMUNITY): Payer: Medicare Other

## 2018-07-01 ENCOUNTER — Ambulatory Visit (HOSPITAL_COMMUNITY): Payer: Medicare Other

## 2018-07-04 ENCOUNTER — Ambulatory Visit (HOSPITAL_COMMUNITY): Payer: Medicare Other

## 2018-07-06 ENCOUNTER — Ambulatory Visit (HOSPITAL_COMMUNITY): Payer: Medicare Other

## 2018-07-08 ENCOUNTER — Ambulatory Visit (HOSPITAL_COMMUNITY): Payer: Medicare Other

## 2018-07-11 ENCOUNTER — Ambulatory Visit (HOSPITAL_COMMUNITY): Payer: Medicare Other

## 2018-07-13 ENCOUNTER — Ambulatory Visit (HOSPITAL_COMMUNITY): Payer: Medicare Other

## 2018-07-15 ENCOUNTER — Ambulatory Visit (HOSPITAL_COMMUNITY): Payer: Medicare Other

## 2018-07-18 ENCOUNTER — Ambulatory Visit (HOSPITAL_COMMUNITY): Payer: Medicare Other

## 2018-07-20 ENCOUNTER — Ambulatory Visit (HOSPITAL_COMMUNITY): Payer: Medicare Other

## 2018-07-22 ENCOUNTER — Ambulatory Visit (HOSPITAL_COMMUNITY): Payer: Medicare Other

## 2018-07-25 ENCOUNTER — Ambulatory Visit (HOSPITAL_COMMUNITY): Payer: Medicare Other

## 2018-07-27 ENCOUNTER — Ambulatory Visit (HOSPITAL_COMMUNITY): Payer: Medicare Other

## 2018-07-29 ENCOUNTER — Ambulatory Visit (HOSPITAL_COMMUNITY): Payer: Medicare Other

## 2018-08-01 ENCOUNTER — Ambulatory Visit (HOSPITAL_COMMUNITY): Payer: Medicare Other

## 2018-08-03 ENCOUNTER — Ambulatory Visit (HOSPITAL_COMMUNITY): Payer: Medicare Other

## 2018-08-05 ENCOUNTER — Ambulatory Visit (HOSPITAL_COMMUNITY): Payer: Medicare Other

## 2018-08-08 ENCOUNTER — Ambulatory Visit (HOSPITAL_COMMUNITY): Payer: Medicare Other

## 2018-08-10 ENCOUNTER — Ambulatory Visit (HOSPITAL_COMMUNITY): Payer: Medicare Other

## 2018-08-12 ENCOUNTER — Ambulatory Visit (HOSPITAL_COMMUNITY): Payer: Medicare Other

## 2018-08-15 ENCOUNTER — Ambulatory Visit (HOSPITAL_COMMUNITY): Payer: Medicare Other

## 2018-08-17 ENCOUNTER — Ambulatory Visit (HOSPITAL_COMMUNITY): Payer: Medicare Other

## 2018-08-19 ENCOUNTER — Ambulatory Visit (HOSPITAL_COMMUNITY): Payer: Medicare Other

## 2018-08-22 ENCOUNTER — Ambulatory Visit (HOSPITAL_COMMUNITY): Payer: Medicare Other

## 2018-08-24 ENCOUNTER — Ambulatory Visit (HOSPITAL_COMMUNITY): Payer: Medicare Other

## 2018-08-26 ENCOUNTER — Ambulatory Visit (HOSPITAL_COMMUNITY): Payer: Medicare Other

## 2018-08-31 ENCOUNTER — Ambulatory Visit (HOSPITAL_COMMUNITY): Payer: Medicare Other

## 2018-09-02 ENCOUNTER — Ambulatory Visit (HOSPITAL_COMMUNITY): Payer: Medicare Other

## 2018-09-05 ENCOUNTER — Ambulatory Visit (HOSPITAL_COMMUNITY): Payer: Medicare Other

## 2018-09-07 ENCOUNTER — Ambulatory Visit (HOSPITAL_COMMUNITY): Payer: Medicare Other

## 2019-01-13 IMAGING — US US THYROID
1 series · 13 of 25 positions shown · non-contrast
Comparison: None.

CLINICAL DATA: Goiter. 76-year-old female with a history of thyroid

EXAM:
THYROID ULTRASOUND
TECHNIQUE: Ultrasound examination of the thyroid gland and adjacent soft
tissues was performed.

[Series 1: us thyroid · 0.05mm/px · 13 of 34 slices shown]
[im 1/34]
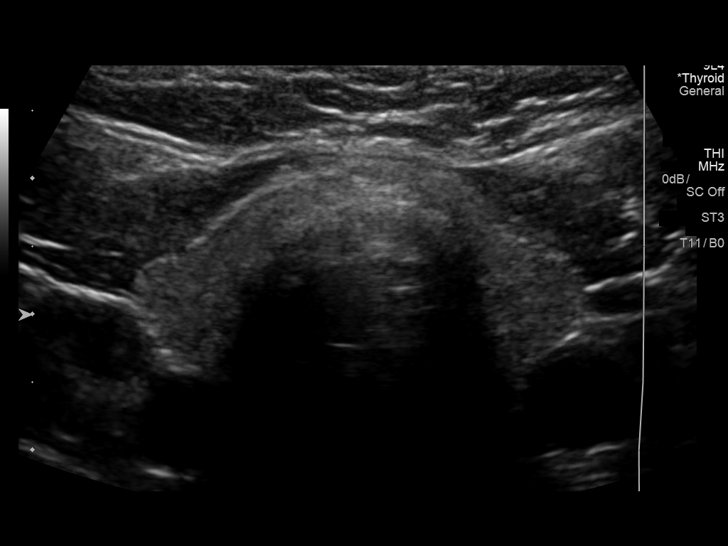
[im 3/34]
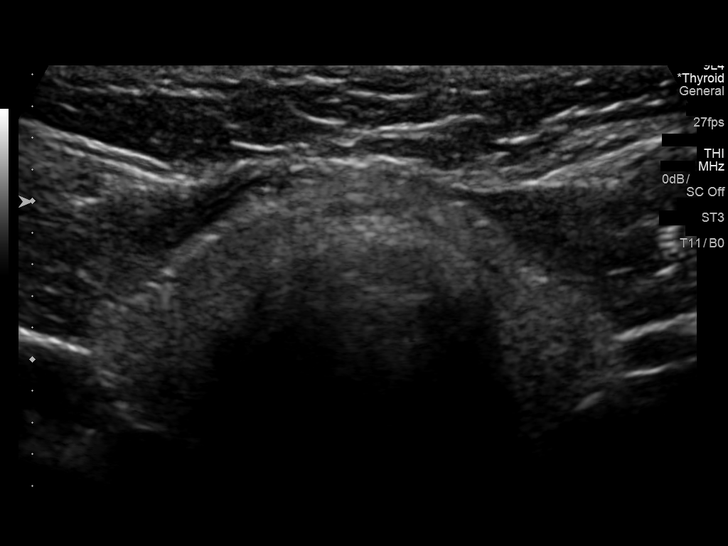
[im 6/34]
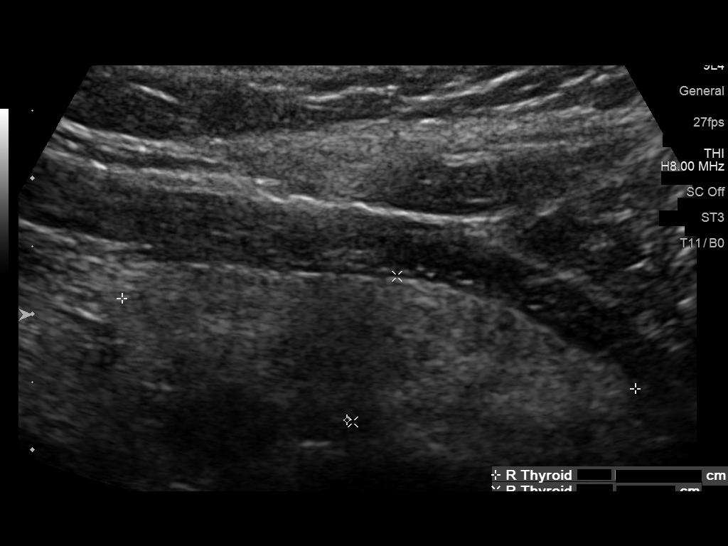
[im 9/34]
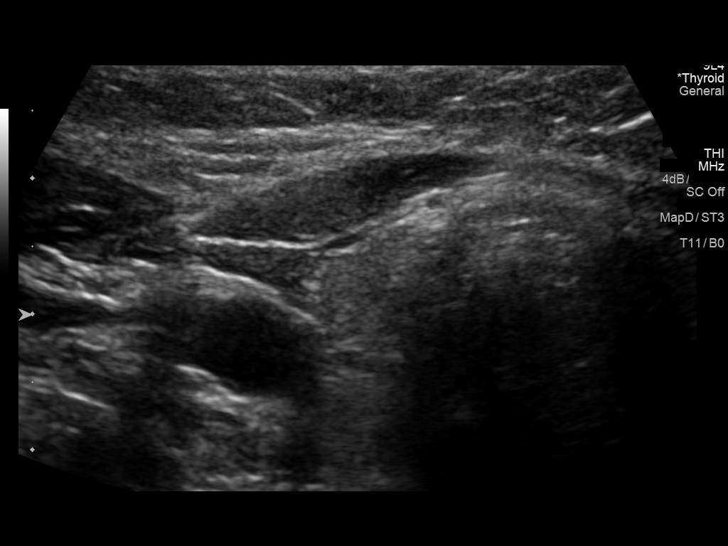
[im 12/34]
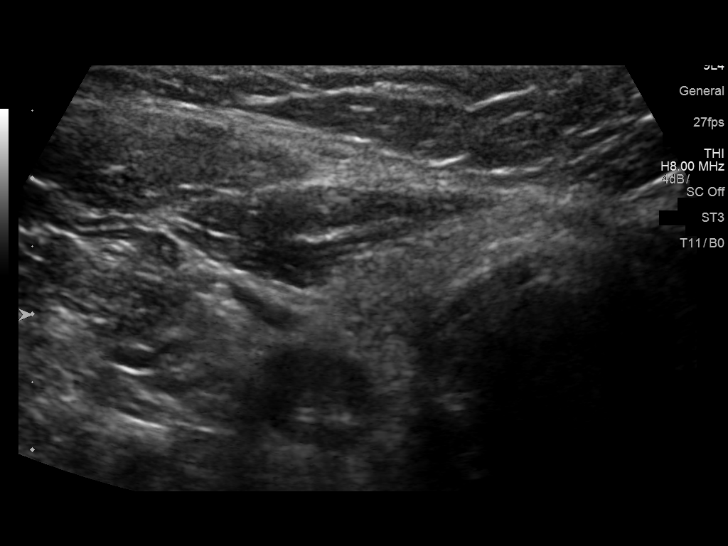
[im 14/34]
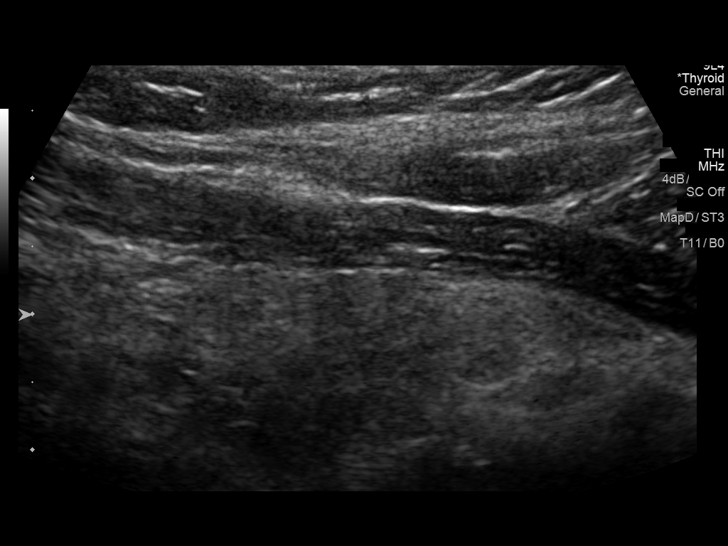
[im 17/34]
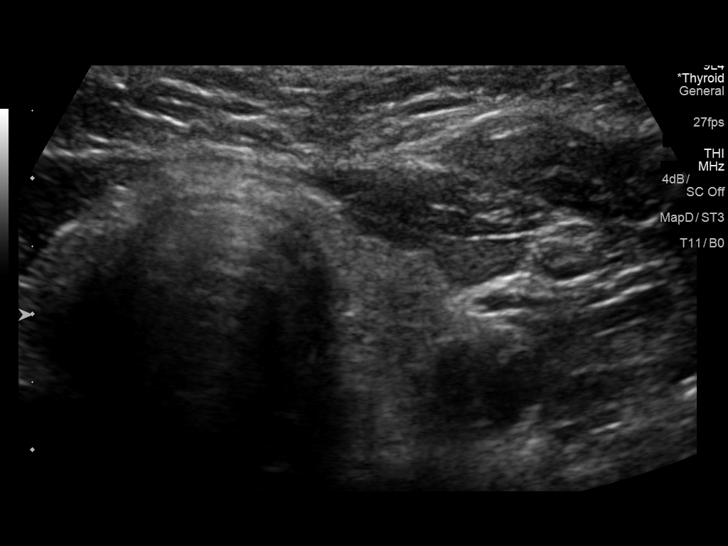
[im 20/34]
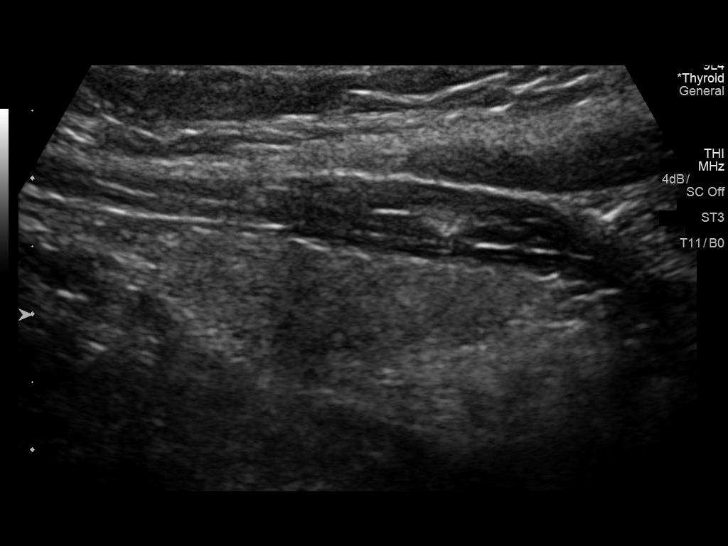
[im 23/34]
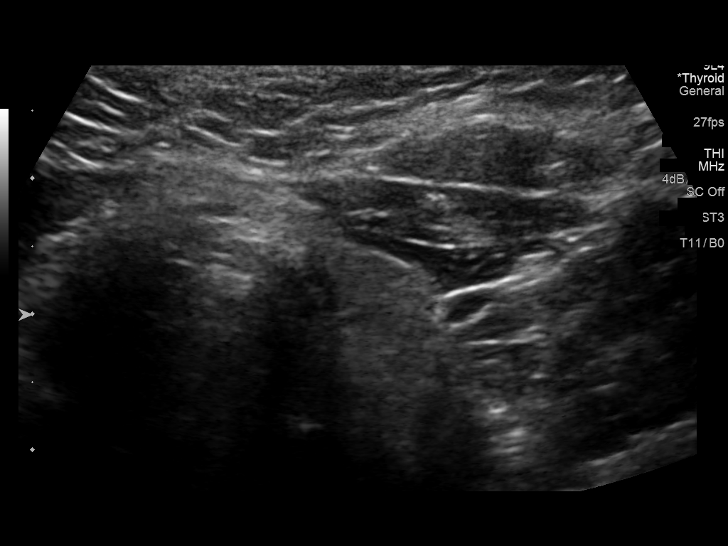
[im 25/34]
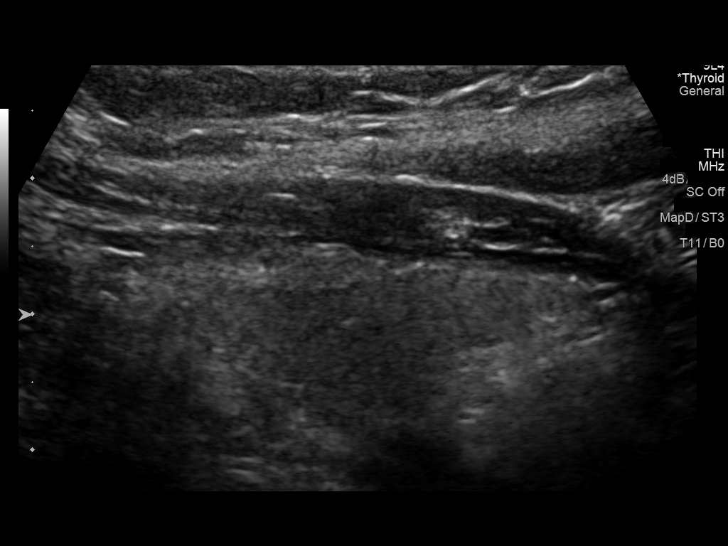
[im 28/34]
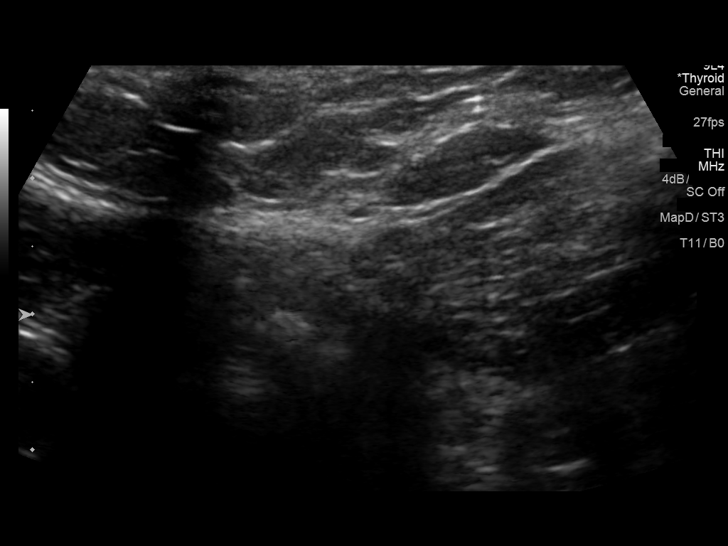
[im 31/34]
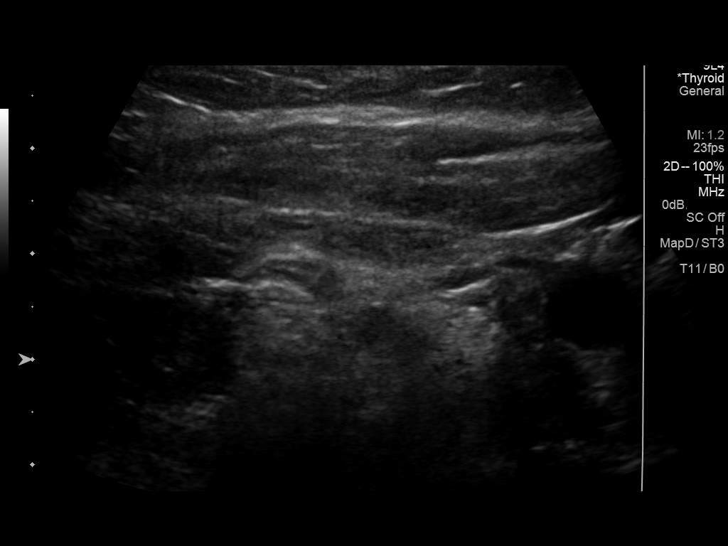
[im 34/34]
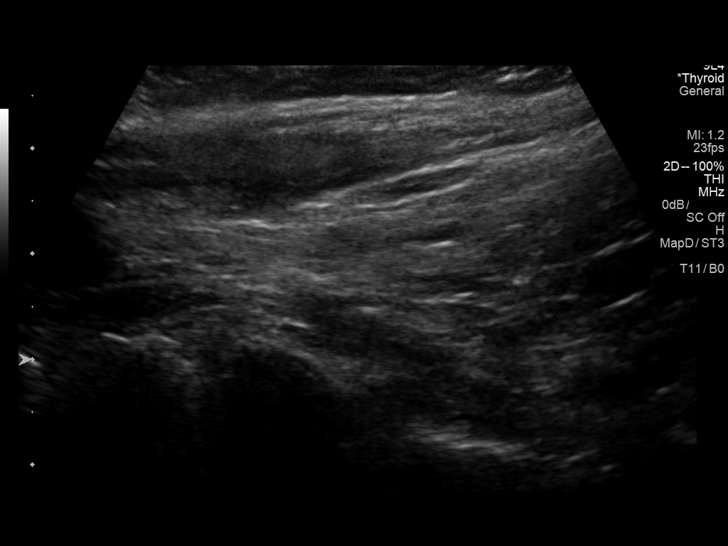

[13 of 25 positions shown; findings below may reference images not displayed]

FINDINGS: Parenchymal Echotexture: Moderately heterogenous

Isthmus: 0.3 cm

Right lobe: 3.8 x 1.1 x 0.7 cm

Left lobe: 3.1 x 1.0 x 0.9 cm

_________________________________________________________

Estimated total number of nodules >/= 1 cm: 0

Number of spongiform nodules >/=  2 cm not described below (TR1): 0

Number of mixed cystic and solid nodules >/= 1.5 cm not described
below (TR2): 0

_________________________________________________________

No discrete nodules of consequence are seen within the thyroid
gland. There is a questionable 3 cm hypoechoic nodule in the right
upper gland which does not meet criteria for continued imaging
follow-up.
IMPRESSION: 1. Diffusely heterogeneous thyroid gland consistent with chronic
thyroid hormone replacement therapy.
2. Incidental note is made of a tiny 0.3 cm nodule versus pseudo
nodule in the right upper gland. This nodule does not meet criteria
for dedicated imaging follow-up.
The above is in keeping with the ACR TI-RADS recommendations - [HOSPITAL] 1899;[DATE].

## 2020-07-24 IMAGING — DX DG CHEST 1V PORT
1 series · 1 of 1 positions shown · non-contrast
Comparison: Radiograph May 03, 2012.

CLINICAL DATA: Chest pain.

EXAM:
PORTABLE CHEST 1 VIEW

[chest]
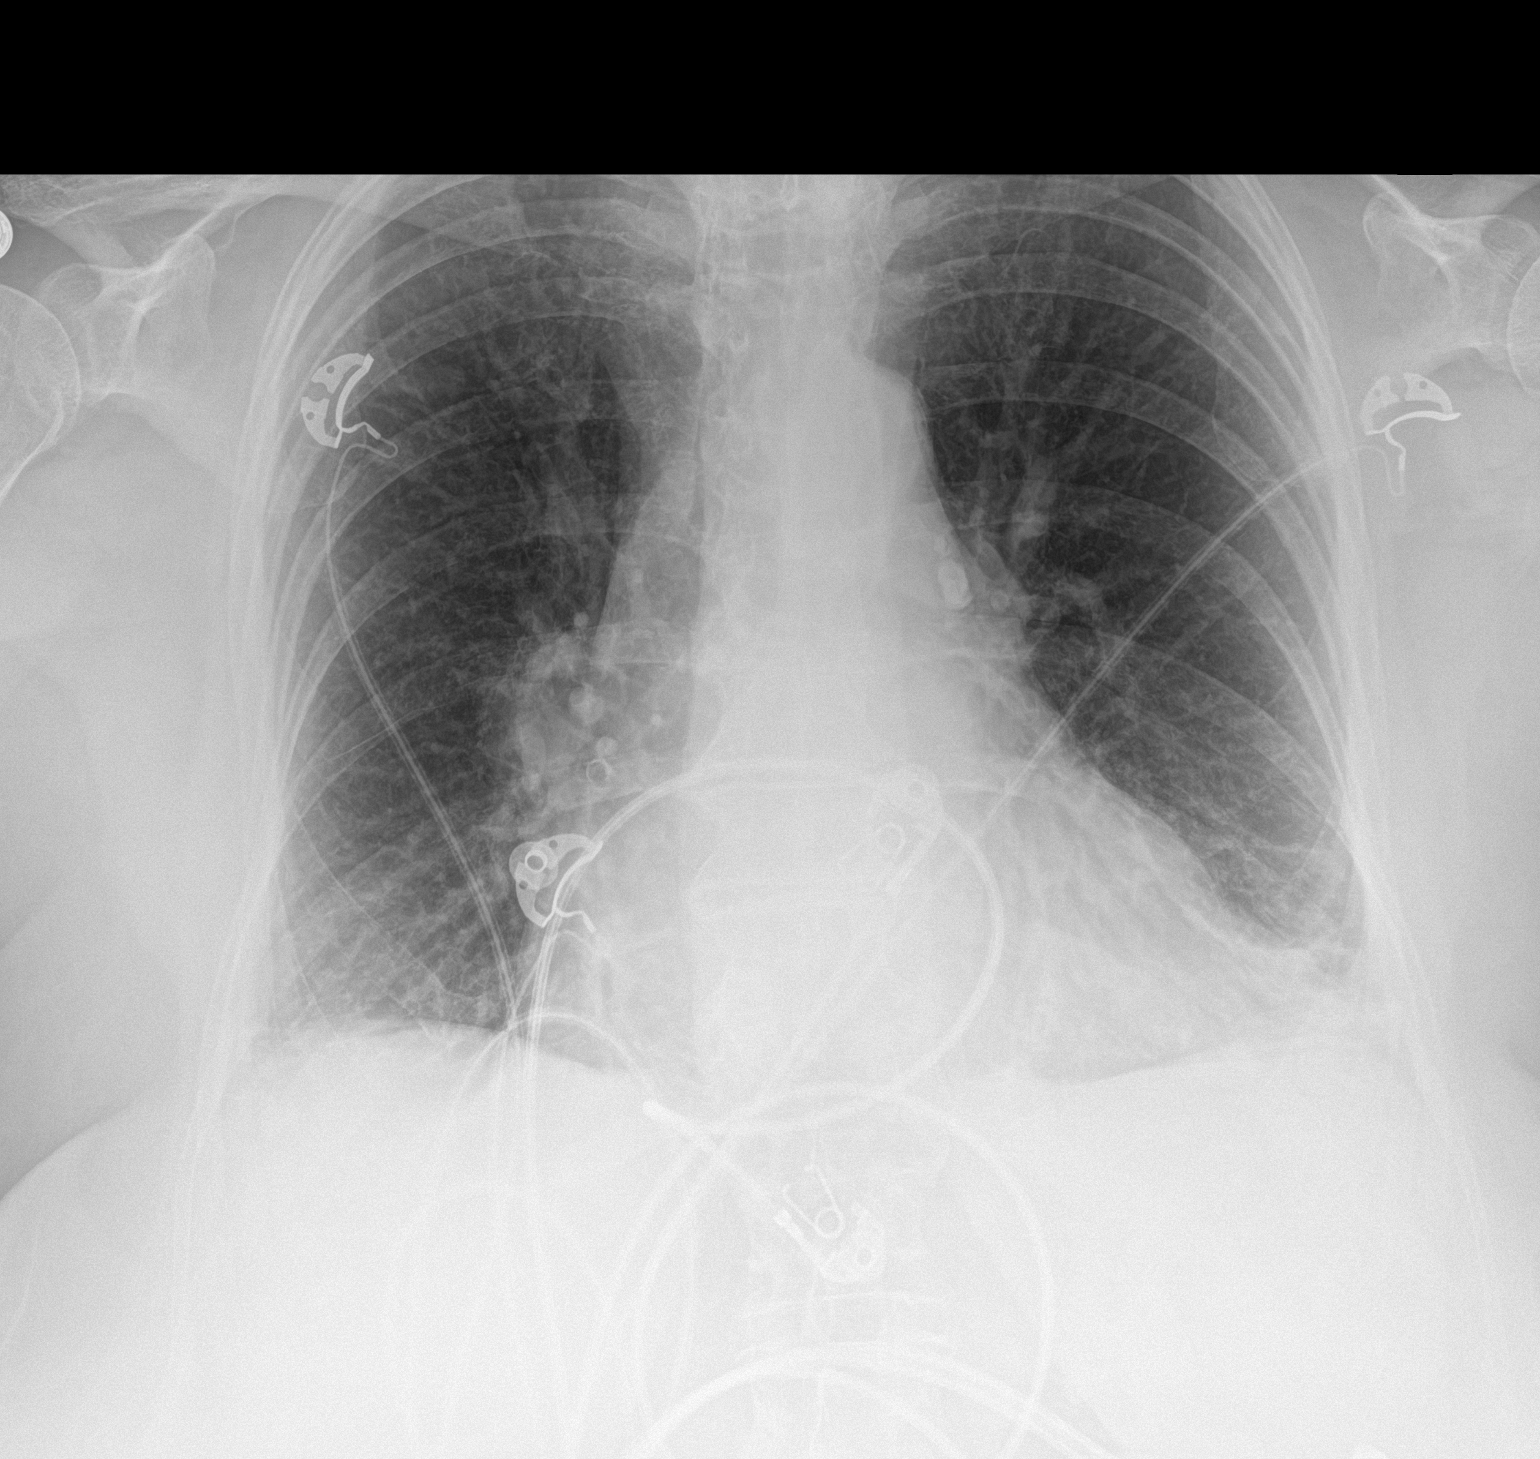

[1 of 1 positions shown; findings below may reference images not displayed]

FINDINGS: Stable cardiomegaly. No pneumothorax or pleural effusion is noted.
Right lung is clear. Mild left basilar atelectasis, scarring or
infiltrate is noted. Bony thorax is unremarkable.
IMPRESSION: Mild left basilar scarring, atelectasis or infiltrate is noted.
